# Patient Record
Sex: Female | Born: 1985 | Race: Black or African American | Hispanic: No | Marital: Married | Smoking: Never smoker
Health system: Southern US, Community
[De-identification: ages and names within clinical notes are randomized; demographics above are authoritative.]

## PROBLEM LIST (undated history)

## (undated) ENCOUNTER — Inpatient Hospital Stay (HOSPITAL_COMMUNITY): Payer: Self-pay

## (undated) DIAGNOSIS — L309 Dermatitis, unspecified: Secondary | ICD-10-CM

## (undated) DIAGNOSIS — R51 Headache: Secondary | ICD-10-CM

## (undated) DIAGNOSIS — F32A Depression, unspecified: Secondary | ICD-10-CM

## (undated) DIAGNOSIS — D649 Anemia, unspecified: Secondary | ICD-10-CM

## (undated) DIAGNOSIS — R519 Headache, unspecified: Secondary | ICD-10-CM

## (undated) DIAGNOSIS — B181 Chronic viral hepatitis B without delta-agent: Secondary | ICD-10-CM

## (undated) DIAGNOSIS — F329 Major depressive disorder, single episode, unspecified: Secondary | ICD-10-CM

## (undated) HISTORY — PX: TOOTH EXTRACTION: SUR596

---

## 2006-12-20 ENCOUNTER — Emergency Department (HOSPITAL_COMMUNITY): Admission: EM | Admit: 2006-12-20 | Discharge: 2006-12-20 | Payer: Self-pay | Admitting: *Deleted

## 2007-01-06 ENCOUNTER — Ambulatory Visit: Payer: Self-pay | Admitting: Internal Medicine

## 2007-03-04 ENCOUNTER — Inpatient Hospital Stay (HOSPITAL_COMMUNITY): Admission: AD | Admit: 2007-03-04 | Discharge: 2007-03-04 | Payer: Self-pay | Admitting: Family Medicine

## 2007-04-17 ENCOUNTER — Ambulatory Visit (HOSPITAL_COMMUNITY): Admission: RE | Admit: 2007-04-17 | Discharge: 2007-04-17 | Payer: Self-pay | Admitting: Obstetrics and Gynecology

## 2007-12-18 ENCOUNTER — Inpatient Hospital Stay (HOSPITAL_COMMUNITY): Admission: AD | Admit: 2007-12-18 | Discharge: 2007-12-18 | Payer: Self-pay | Admitting: Obstetrics & Gynecology

## 2008-05-25 ENCOUNTER — Inpatient Hospital Stay (HOSPITAL_COMMUNITY): Admission: AD | Admit: 2008-05-25 | Discharge: 2008-05-25 | Payer: Self-pay | Admitting: Obstetrics & Gynecology

## 2008-06-08 ENCOUNTER — Ambulatory Visit (HOSPITAL_COMMUNITY): Admission: RE | Admit: 2008-06-08 | Discharge: 2008-06-08 | Payer: Self-pay | Admitting: Family Medicine

## 2008-06-17 ENCOUNTER — Encounter: Payer: Self-pay | Admitting: Internal Medicine

## 2008-07-22 ENCOUNTER — Ambulatory Visit: Payer: Self-pay | Admitting: Obstetrics & Gynecology

## 2008-07-22 ENCOUNTER — Inpatient Hospital Stay (HOSPITAL_COMMUNITY): Admission: AD | Admit: 2008-07-22 | Discharge: 2008-07-24 | Payer: Self-pay | Admitting: Obstetrics & Gynecology

## 2008-07-24 ENCOUNTER — Inpatient Hospital Stay (HOSPITAL_COMMUNITY): Admission: AD | Admit: 2008-07-24 | Discharge: 2008-07-25 | Payer: Self-pay | Admitting: Obstetrics & Gynecology

## 2008-07-24 ENCOUNTER — Ambulatory Visit: Payer: Self-pay | Admitting: Advanced Practice Midwife

## 2008-07-26 ENCOUNTER — Inpatient Hospital Stay (HOSPITAL_COMMUNITY): Admission: AD | Admit: 2008-07-26 | Discharge: 2008-07-26 | Payer: Self-pay | Admitting: Obstetrics & Gynecology

## 2008-07-29 ENCOUNTER — Ambulatory Visit: Payer: Self-pay | Admitting: Obstetrics & Gynecology

## 2008-07-29 LAB — CONVERTED CEMR LAB

## 2008-08-12 ENCOUNTER — Ambulatory Visit: Payer: Self-pay | Admitting: Obstetrics & Gynecology

## 2008-08-26 ENCOUNTER — Ambulatory Visit: Payer: Self-pay | Admitting: Obstetrics & Gynecology

## 2008-08-28 ENCOUNTER — Ambulatory Visit: Payer: Self-pay | Admitting: Advanced Practice Midwife

## 2008-08-28 ENCOUNTER — Inpatient Hospital Stay (HOSPITAL_COMMUNITY): Admission: AD | Admit: 2008-08-28 | Discharge: 2008-08-28 | Payer: Self-pay | Admitting: Obstetrics and Gynecology

## 2008-09-06 ENCOUNTER — Inpatient Hospital Stay (HOSPITAL_COMMUNITY): Admission: AD | Admit: 2008-09-06 | Discharge: 2008-09-06 | Payer: Self-pay | Admitting: Obstetrics & Gynecology

## 2008-09-09 ENCOUNTER — Ambulatory Visit: Payer: Self-pay | Admitting: Obstetrics & Gynecology

## 2008-09-10 ENCOUNTER — Encounter: Payer: Self-pay | Admitting: Obstetrics & Gynecology

## 2008-09-16 ENCOUNTER — Ambulatory Visit: Payer: Self-pay | Admitting: Obstetrics & Gynecology

## 2008-09-23 ENCOUNTER — Ambulatory Visit: Payer: Self-pay | Admitting: Obstetrics & Gynecology

## 2008-09-25 ENCOUNTER — Inpatient Hospital Stay (HOSPITAL_COMMUNITY): Admission: AD | Admit: 2008-09-25 | Discharge: 2008-09-25 | Payer: Self-pay | Admitting: Obstetrics & Gynecology

## 2008-09-29 ENCOUNTER — Ambulatory Visit: Payer: Self-pay | Admitting: Advanced Practice Midwife

## 2008-09-29 ENCOUNTER — Inpatient Hospital Stay (HOSPITAL_COMMUNITY): Admission: AD | Admit: 2008-09-29 | Discharge: 2008-10-02 | Payer: Self-pay | Admitting: Obstetrics & Gynecology

## 2008-11-26 ENCOUNTER — Encounter: Payer: Self-pay | Admitting: Internal Medicine

## 2008-12-15 ENCOUNTER — Encounter (INDEPENDENT_AMBULATORY_CARE_PROVIDER_SITE_OTHER): Payer: Self-pay | Admitting: *Deleted

## 2008-12-15 DIAGNOSIS — B1910 Unspecified viral hepatitis B without hepatic coma: Secondary | ICD-10-CM | POA: Insufficient documentation

## 2008-12-15 DIAGNOSIS — E669 Obesity, unspecified: Secondary | ICD-10-CM

## 2008-12-21 ENCOUNTER — Ambulatory Visit: Payer: Self-pay | Admitting: Internal Medicine

## 2008-12-21 LAB — CONVERTED CEMR LAB
ALT: 29 units/L (ref 0–35)
Alkaline Phosphatase: 49 units/L (ref 39–117)
BUN: 7 mg/dL (ref 6–23)
Glucose, Bld: 81 mg/dL (ref 70–99)
HCT: 36.6 % (ref 36.0–46.0)
Hep A Total Ab: POSITIVE — AB
Hepatitis B DNA (Calc): 18100 copies/mL — ABNORMAL HIGH (ref <169)
Hepatitis B DNA: 3110 IU/mL — ABNORMAL HIGH (ref <29)
Hepatitis B Surface Ag: POSITIVE — AB
MCHC: 33.7 g/dL (ref 30.0–36.0)
MCV: 83.2 fL (ref >=78.0)
Prothrombin Time: 14 s (ref 11.6–15.2)
RBC: 4.41 M/uL (ref 3.87–5.11)
RDW: 13.9 % (ref 11.5–15.5)

## 2009-01-04 ENCOUNTER — Ambulatory Visit: Payer: Self-pay | Admitting: Internal Medicine

## 2009-01-19 ENCOUNTER — Encounter: Payer: Self-pay | Admitting: Internal Medicine

## 2009-02-03 ENCOUNTER — Encounter: Payer: Self-pay | Admitting: Internal Medicine

## 2009-08-08 ENCOUNTER — Encounter: Admission: RE | Admit: 2009-08-08 | Discharge: 2009-08-08 | Payer: Self-pay | Admitting: Infectious Diseases

## 2010-06-03 LAB — CBC
Hemoglobin: 11.9 g/dL — ABNORMAL LOW (ref 12.0–15.0)
RBC: 4.1 MIL/uL (ref 3.87–5.11)
RDW: 14.1 % (ref 11.5–15.5)

## 2010-06-03 LAB — RPR: RPR Ser Ql: NONREACTIVE

## 2010-06-04 LAB — POCT URINALYSIS DIP (DEVICE)
Bilirubin Urine: NEGATIVE
Bilirubin Urine: NEGATIVE
Glucose, UA: NEGATIVE mg/dL
Glucose, UA: NEGATIVE mg/dL
Glucose, UA: NEGATIVE mg/dL
Glucose, UA: NEGATIVE mg/dL
Hgb urine dipstick: NEGATIVE
Ketones, ur: NEGATIVE mg/dL
Ketones, ur: NEGATIVE mg/dL
Nitrite: NEGATIVE
Nitrite: NEGATIVE
Protein, ur: NEGATIVE mg/dL
Protein, ur: NEGATIVE mg/dL
Specific Gravity, Urine: 1.015 (ref 1.005–1.030)
Specific Gravity, Urine: 1.015 (ref 1.005–1.030)
Specific Gravity, Urine: 1.015 (ref 1.005–1.030)
Urobilinogen, UA: 0.2 mg/dL (ref 0.0–1.0)
Urobilinogen, UA: 0.2 mg/dL (ref 0.0–1.0)
Urobilinogen, UA: 0.2 mg/dL (ref 0.0–1.0)
Urobilinogen, UA: 0.2 mg/dL (ref 0.0–1.0)
pH: 7 (ref 5.0–8.0)
pH: 7.5 (ref 5.0–8.0)

## 2010-06-04 LAB — URINALYSIS, ROUTINE W REFLEX MICROSCOPIC
Glucose, UA: NEGATIVE mg/dL
Hgb urine dipstick: NEGATIVE
Ketones, ur: NEGATIVE mg/dL
Nitrite: NEGATIVE
Specific Gravity, Urine: 1.01 (ref 1.005–1.030)

## 2010-06-04 LAB — GC/CHLAMYDIA PROBE AMP, GENITAL
Chlamydia, DNA Probe: NEGATIVE
GC Probe Amp, Genital: NEGATIVE

## 2010-06-04 LAB — WET PREP, GENITAL
Clue Cells Wet Prep HPF POC: NONE SEEN
Trich, Wet Prep: NONE SEEN
Yeast Wet Prep HPF POC: NONE SEEN

## 2010-06-05 LAB — POCT URINALYSIS DIP (DEVICE)
Bilirubin Urine: NEGATIVE
Glucose, UA: NEGATIVE mg/dL
Hgb urine dipstick: NEGATIVE
Hgb urine dipstick: NEGATIVE
Ketones, ur: NEGATIVE mg/dL
Nitrite: NEGATIVE
Nitrite: NEGATIVE
Protein, ur: NEGATIVE mg/dL
Specific Gravity, Urine: 1.015 (ref 1.005–1.030)
Urobilinogen, UA: 0.2 mg/dL (ref 0.0–1.0)
pH: 7 (ref 5.0–8.0)

## 2010-06-06 LAB — WET PREP, GENITAL: Yeast Wet Prep HPF POC: NONE SEEN

## 2010-06-06 LAB — URINALYSIS, ROUTINE W REFLEX MICROSCOPIC
Hgb urine dipstick: NEGATIVE
Specific Gravity, Urine: <1.005 — ABNORMAL LOW (ref 1.005–1.030)
Urobilinogen, UA: 0.2 mg/dL (ref 0.0–1.0)
pH: 7.5 (ref 5.0–8.0)

## 2010-06-06 LAB — STREP B DNA PROBE: Strep Group B Ag: NEGATIVE

## 2010-06-06 LAB — GC/CHLAMYDIA PROBE AMP, GENITAL
Chlamydia, DNA Probe: NEGATIVE
GC Probe Amp, Genital: NEGATIVE

## 2010-06-06 LAB — URINALYSIS, MICROSCOPIC ONLY
Nitrite: NEGATIVE
Protein, ur: NEGATIVE mg/dL
Urobilinogen, UA: 0.2 mg/dL (ref 0.0–1.0)

## 2010-06-06 LAB — RAPID URINE DRUG SCREEN, HOSP PERFORMED
Benzodiazepines: NOT DETECTED
Cocaine: NOT DETECTED

## 2010-06-06 LAB — CBC
HCT: 32.3 % — ABNORMAL LOW (ref 36.0–46.0)
Hemoglobin: 11.1 g/dL — ABNORMAL LOW (ref 12.0–15.0)
MCHC: 34.4 g/dL (ref 30.0–36.0)

## 2010-06-08 LAB — DIFFERENTIAL
Basophils Absolute: 0 10*3/uL (ref 0.0–0.1)
Basophils Relative: 1 % (ref 0–1)
Eosinophils Absolute: 0.2 10*3/uL (ref 0.0–0.7)
Monocytes Relative: 7 % (ref 3–12)
Neutro Abs: 4.8 10*3/uL (ref 1.7–7.7)
Neutrophils Relative %: 66 % (ref 43–77)

## 2010-06-08 LAB — URINALYSIS, ROUTINE W REFLEX MICROSCOPIC
Glucose, UA: NEGATIVE mg/dL
Hgb urine dipstick: NEGATIVE
Ketones, ur: NEGATIVE mg/dL
Protein, ur: NEGATIVE mg/dL
pH: 8 (ref 5.0–8.0)

## 2010-06-08 LAB — CBC
MCHC: 33.2 g/dL (ref 30.0–36.0)
MCV: 83.1 fL (ref 78.0–100.0)
Platelets: 208 10*3/uL (ref 150–400)
RBC: 3.83 MIL/uL — ABNORMAL LOW (ref 3.87–5.11)
RDW: 13.8 % (ref 11.5–15.5)

## 2010-06-08 LAB — GC/CHLAMYDIA PROBE AMP, GENITAL
Chlamydia, DNA Probe: NEGATIVE
GC Probe Amp, Genital: NEGATIVE

## 2010-06-08 LAB — RUBELLA SCREEN: Rubella: 38.8 IU/mL — ABNORMAL HIGH

## 2010-06-08 LAB — WET PREP, GENITAL
Clue Cells Wet Prep HPF POC: NONE SEEN
Trich, Wet Prep: NONE SEEN
Yeast Wet Prep HPF POC: NONE SEEN

## 2010-06-08 LAB — RPR: RPR Ser Ql: NONREACTIVE

## 2010-07-11 NOTE — Discharge Summary (Signed)
NAMEULANI, DEGRASSE NO.:  [id number]   MEDICAL RECORD NO.:  [id number]           PATIENT TYPE:   LOCATION:                                 FACILITY:   PHYSICIAN:  Scheryl Darter, MD       DATE OF BIRTH:  1985-04-04   DATE OF ADMISSION:  07/03/2008  DATE OF DISCHARGE:                               DISCHARGE SUMMARY   DIAGNOSIS:  Twenty nine weeks' gestation with preterm labor.   The patient is a 25 year old African American female gravida 2, para 1,  abortus 1, last menstrual period was unsure, approximately January 06, 2008, with estimated date of confinement by ultrasound October 07, 2008,  who was admitted on Jul 03, 2008, due to preterm labor at 63 weeks'  gestation.  The patient was having irregular contractions.  No bleeding.  No rupture of membranes.  Being cared at [facility].   PAST OBSTETRICAL HISTORY:  One elective abortion and one term, vaginal  delivery at 38-1/2 weeks in [place], female 7 pounds 9 ounces.   SOCIAL HISTORY:  The patient is married.  She is originally from  [place].  She lived for several years in [place].  Her husband is  35  years old.  She denies alcohol, tobacco, or drug use.   GYN HISTORY:  The patient had Chlamydia in June 2009.   FAMILY HISTORY:  Unremarkable for any genetic diseases.   PHYSICAL EXAMINATION:  VITAL SIGNS:  Admission weight is 119 pounds.  CHEST:  Clear.  HEART:  Regular rate and rhythm.  ABDOMEN:  Gravid.  Cervix initially was thought to have a parous external os with closed  internal os, but repeat exam, cervix was 50% effaced, 1-2 cm dilated,  cephalic, -2 station.  Fetal fibronectin was negative.  The patient was  admitted due to preterm labor.  She was placed on magnesium sulfate and  she received 2 doses of betamethasone.  Magnesium sulfate was stopped  after 12 hours.  She also received 1 dose of subcu terbutaline, which  caused increased anxiety.  She refused to start  magnesium sulfate again.  Apparently, contraction did not resume after having tocolytics.   PRENATAL LABORATORY DATA:  The patient had positive hepatitis B surface  antigen screening, which was confirmatory.  Plan was patient to receive  hepatitis B immunoglobulin in the emergency department.   IMPRESSION:  1. Intrauterine pregnancy of 29 weeks' gestation, preterm labor,      arrested.  2. Hepatitis B surface antigen.   PLAN:  The patient will be discharged home.  Prescription for Procardia  ER 30 mg a day.  She was instructed in signs and symptoms of preterm  labor.  She was instructed to have decreased activity and pelvic rest.  She is to return to follow up at the [facility] on July 29, 2008.      Scheryl Darter, MD  Electronically Signed     JA/MEDQ  D:  07/24/2008  T:  07/25/2008  Job:  [id number]

## 2010-10-08 ENCOUNTER — Encounter (HOSPITAL_COMMUNITY): Payer: Self-pay | Admitting: *Deleted

## 2010-10-08 ENCOUNTER — Inpatient Hospital Stay (HOSPITAL_COMMUNITY)
Admission: AD | Admit: 2010-10-08 | Discharge: 2010-10-09 | Disposition: A | Discharge status: Home / Self Care | Payer: Self-pay | Source: Ambulatory Visit | Attending: Obstetrics & Gynecology | Admitting: Obstetrics & Gynecology

## 2010-10-08 DIAGNOSIS — R109 Unspecified abdominal pain: Secondary | ICD-10-CM | POA: Insufficient documentation

## 2010-10-08 DIAGNOSIS — N83209 Unspecified ovarian cyst, unspecified side: Secondary | ICD-10-CM | POA: Insufficient documentation

## 2010-10-08 LAB — URINALYSIS, ROUTINE W REFLEX MICROSCOPIC
Bilirubin Urine: NEGATIVE
Glucose, UA: NEGATIVE mg/dL
Leukocytes, UA: NEGATIVE
Nitrite: NEGATIVE
Specific Gravity, Urine: 1.02 (ref 1.005–1.030)
pH: 6.5 (ref 5.0–8.0)

## 2010-10-08 NOTE — Progress Notes (Signed)
Pt LMP beginning of June.  Pt has implanon.  Pt having increased lower abd cramping and spotting x 2 days.

## 2010-10-08 NOTE — Progress Notes (Signed)
Pt c/o lower abd pain.  Has implanon inserted 01/2009.

## 2010-10-08 NOTE — ED Provider Notes (Signed)
History     Chief Complaint  Patient presents with  . Abdominal Pain   HPI  Pt reports intermittent suprapubic pelvic pain x 2 weeks.  LMP June; using Implanon.  Reports dark vaginal discharge, occurs commonly.  Past Medical History  Diagnosis Date  . No pertinent past medical history     Past Surgical History  Procedure Date  . No past surgeries     No family history on file.  History  Substance Use Topics  . Smoking status: Never Smoker   . Smokeless tobacco: Not on file  . Alcohol Use: No    Allergies: No Known Allergies  No prescriptions prior to admission    Review of Systems  Gastrointestinal: Positive for abdominal pain.  Genitourinary:       Dark red discharge  All other systems reviewed and are negative.   Physical Exam   Blood pressure 111/78, pulse 75, temperature 98.2 F (36.8 C), temperature source Oral, resp. rate 20, height 5\' 5"  (1.651 m), weight 92.352 kg (203 lb 9.6 oz), last menstrual period 07/28/2010.  Physical Exam  Constitutional: She is oriented to person, place, and time. She appears well-developed and well-nourished. No distress (uncomfortable appearing w/exam).  HENT:  Head: Normocephalic.  Neck: Normal range of motion. Neck supple.  Cardiovascular: Normal rate, regular rhythm and normal heart sounds.   Respiratory: Effort normal and breath sounds normal.  GI: Soft. She exhibits mass. There is tenderness.  Genitourinary: Right adnexum displays tenderness. There is bleeding around the vagina. Vaginal discharge (dark blood) found.  Neurological: She is alert and oriented to person, place, and time. She has normal reflexes.  Skin: Skin is warm and dry.    MAU Course  Procedures  Ultrasound LDH/AFP level Wet Prep GC/CT  Assessment and Plan  Ovarian Cyst  Plan: DC to home Repeat US in 8 weeks Rx Tylenol#3 F/U in clinic in 8-10 weeks   [facility] 10/08/2010, 11:54 PM

## 2010-10-09 ENCOUNTER — Telehealth: Payer: Self-pay | Admitting: Family

## 2010-10-09 ENCOUNTER — Inpatient Hospital Stay (HOSPITAL_COMMUNITY): Payer: Self-pay

## 2010-10-09 ENCOUNTER — Other Ambulatory Visit: Payer: Self-pay | Admitting: Obstetrics & Gynecology

## 2010-10-09 LAB — WET PREP, GENITAL: Yeast Wet Prep HPF POC: NONE SEEN

## 2010-10-09 MED ORDER — IBUPROFEN 600 MG PO TABS: 600.0000 mg | ORAL_TABLET | Freq: Four times a day (QID) | ORAL | Status: AC | PRN

## 2010-10-09 MED ORDER — PROMETHAZINE HCL 12.5 MG PO TABS: 12.5000 mg | ORAL_TABLET | Freq: Four times a day (QID) | ORAL | Status: DC | PRN

## 2010-10-09 MED ORDER — ACETAMINOPHEN-CODEINE 300-30 MG PO TABS: 1.0000 | ORAL_TABLET | ORAL | Status: AC | PRN

## 2010-10-09 MED ORDER — ONDANSETRON 8 MG PO TBDP
8.0000 mg | ORAL_TABLET | Freq: Once | ORAL | Status: AC
  Administered 2010-10-09: 8 mg via ORAL
  Filled 2010-10-09: qty 1

## 2010-10-09 NOTE — Consult Note (Signed)
Reviewed HPI/Exam/US results>draw LDH and AFP level and have pt follow-up in clinic in 8-10 weeks after scheduled repeat US.

## 2010-10-09 NOTE — ED Provider Notes (Signed)
Called Amber in ultrasound to confirm +flow to ovaries.

## 2010-10-09 NOTE — ED Provider Notes (Signed)
Agree with above note.  Paige Bates H. 10/09/2010 6:12 AM

## 2010-10-09 NOTE — ED Provider Notes (Signed)
Agree with above note.  Paige Bates H. 10/09/2010 6:13 AM

## 2010-10-10 LAB — GC/CHLAMYDIA PROBE AMP, GENITAL
Chlamydia, DNA Probe: NEGATIVE
GC Probe Amp, Genital: NEGATIVE

## 2010-11-16 LAB — URINALYSIS, ROUTINE W REFLEX MICROSCOPIC
Glucose, UA: NEGATIVE
Hgb urine dipstick: NEGATIVE
pH: 7.5

## 2010-11-16 LAB — GC/CHLAMYDIA PROBE AMP, GENITAL
Chlamydia, DNA Probe: POSITIVE — AB
GC Probe Amp, Genital: NEGATIVE

## 2010-11-23 ENCOUNTER — Encounter: Payer: Self-pay | Admitting: Obstetrics & Gynecology

## 2010-11-27 LAB — GC/CHLAMYDIA PROBE AMP, GENITAL
Chlamydia, DNA Probe: NEGATIVE
GC Probe Amp, Genital: NEGATIVE

## 2010-11-27 LAB — CBC
Platelets: 285
RBC: 4.5
WBC: 4.8

## 2010-11-27 LAB — WET PREP, GENITAL
Clue Cells Wet Prep HPF POC: NONE SEEN
Trich, Wet Prep: NONE SEEN

## 2010-12-06 LAB — GC/CHLAMYDIA PROBE AMP, GENITAL: Chlamydia, DNA Probe: POSITIVE — AB

## 2010-12-06 LAB — I-STAT 8, (EC8 V) (CONVERTED LAB)
BUN: 5 — ABNORMAL LOW
Bicarbonate: 24.7 — ABNORMAL HIGH
Chloride: 105
Glucose, Bld: 84
Hemoglobin: 11.6 — ABNORMAL LOW
Operator id: 294501
Sodium: 139

## 2010-12-06 LAB — URINALYSIS, ROUTINE W REFLEX MICROSCOPIC
Bilirubin Urine: NEGATIVE
Glucose, UA: NEGATIVE
Hgb urine dipstick: NEGATIVE
Protein, ur: NEGATIVE
Specific Gravity, Urine: 1.02
Urobilinogen, UA: 1

## 2010-12-06 LAB — WET PREP, GENITAL: Yeast Wet Prep HPF POC: NONE SEEN

## 2010-12-06 LAB — POCT PREGNANCY, URINE: Operator id: 28833

## 2010-12-06 LAB — RPR: RPR Ser Ql: NONREACTIVE

## 2011-03-09 ENCOUNTER — Emergency Department (INDEPENDENT_AMBULATORY_CARE_PROVIDER_SITE_OTHER)
Admission: EM | Admit: 2011-03-09 | Discharge: 2011-03-09 | Disposition: A | Discharge status: Home / Self Care | Payer: Self-pay | Source: Home / Self Care | Attending: Emergency Medicine | Admitting: Emergency Medicine

## 2011-03-09 ENCOUNTER — Encounter (HOSPITAL_COMMUNITY): Payer: Self-pay | Admitting: Emergency Medicine

## 2011-03-09 DIAGNOSIS — H68009 Unspecified Eustachian salpingitis, unspecified ear: Secondary | ICD-10-CM

## 2011-03-09 MED ORDER — CHLORPHENIRAMINE-PSEUDOEPH 12-120 MG PO TB12: 1.0000 | ORAL_TABLET | Freq: Two times a day (BID) | ORAL | Status: DC

## 2011-03-09 NOTE — ED Provider Notes (Signed)
Chief Complaint  Patient presents with  . 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 She did not feel feverish. Her symptoms have gotten better with the exception of bilateral ear congestion. She denies any pain or drainage. She had or ears tested by the company nurse and was told that she was unable to fly because of ear congestion. She comes in today for recheck.  Review of Systems:  Other than noted above, the patient denies any of the following symptoms. Systemic:  No fever, chills, sweats, fatigue, myalgias, headache, or anorexia. Eye:  No redness, pain or drainage. ENT:  No earache, nasal congestion, rhinorrhea, sinus pressure, or sore throat. Lungs:  No cough, sputum production, wheezing, shortness of breath. Or chest pain. GI:  No nausea, vomiting, abdominal pain or diarrhea. Skin:  No rash or itching.  PMFSH:  Past medical history, family history, social history, meds, and allergies were reviewed.  Physical Exam:   Vital signs:  BP 110/68  Pulse 79  Temp(Src) 98.7 F (37.1 C) (Oral)  Resp 20  SpO2 99% General:  Alert, in no distress. Eye:  No conjunctival injection or drainage. ENT:  TMs and canals were normal, without erythema or inflammation.  Nasal mucosa was clear and uncongested, without drainage.  Mucous membranes were moist.  Pharynx was clear, without exudate or drainage.  There were no oral ulcerations or lesions. Neck:  Supple, no adenopathy, tenderness or mass. Lungs:  No respiratory distress.  Lungs were clear to auscultation, without wheezes, rales or rhonchi.  Breath sounds were clear and equal bilaterally. Heart:  Regular rhythm, without gallops, murmers or rubs. Skin:  Clear, warm, and dry, without rash or lesions.  Assessment:   Diagnoses that have been ruled out:  None  Diagnoses that are still under consideration:  None    Final diagnoses:  Eustachian salpingitis      Plan:   1.  The following meds were prescribed:   New Prescriptions   CHLORPHENIRAMINE-PSEUDOEPH 12-120 MG TB12    Take 1 tablet by mouth 2 (two) times daily.   2.  The patient was instructed in symptomatic care and handouts were given. 3.  The patient was told to return if becoming worse in any way, if no better in 3 or 4 days, and given some red flag symptoms that would indicate earlier return.   Roque Lias, MD 03/09/11 [phone]

## 2011-03-09 NOTE — ED Notes (Signed)
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

## 2011-11-17 ENCOUNTER — Encounter (HOSPITAL_COMMUNITY): Payer: Self-pay

## 2011-11-17 ENCOUNTER — Inpatient Hospital Stay (HOSPITAL_COMMUNITY)
Admission: AD | Admit: 2011-11-17 | Discharge: 2011-11-17 | Disposition: A | Discharge status: Home / Self Care | Payer: Medicaid Other | Source: Ambulatory Visit | Attending: Obstetrics & Gynecology | Admitting: Obstetrics & Gynecology

## 2011-11-17 DIAGNOSIS — O219 Vomiting of pregnancy, unspecified: Secondary | ICD-10-CM

## 2011-11-17 DIAGNOSIS — R109 Unspecified abdominal pain: Secondary | ICD-10-CM | POA: Insufficient documentation

## 2011-11-17 DIAGNOSIS — O210 Mild hyperemesis gravidarum: Secondary | ICD-10-CM | POA: Insufficient documentation

## 2011-11-17 DIAGNOSIS — K117 Disturbances of salivary secretion: Secondary | ICD-10-CM | POA: Insufficient documentation

## 2011-11-17 DIAGNOSIS — O9989 Other specified diseases and conditions complicating pregnancy, childbirth and the puerperium: Secondary | ICD-10-CM | POA: Insufficient documentation

## 2011-11-17 HISTORY — DX: Depression, unspecified: F32.A

## 2011-11-17 HISTORY — DX: Anemia, unspecified: D64.9

## 2011-11-17 HISTORY — DX: Major depressive disorder, single episode, unspecified: F32.9

## 2011-11-17 LAB — URINALYSIS, ROUTINE W REFLEX MICROSCOPIC
Bilirubin Urine: NEGATIVE
Leukocytes, UA: NEGATIVE
Nitrite: NEGATIVE
Specific Gravity, Urine: >1.03 — ABNORMAL HIGH (ref 1.005–1.030)
Urobilinogen, UA: 0.2 mg/dL (ref 0.0–1.0)
pH: 6 (ref 5.0–8.0)

## 2011-11-17 LAB — URINE MICROSCOPIC-ADD ON

## 2011-11-17 MED ORDER — ONDANSETRON 8 MG PO TBDP: 8.0000 mg | ORAL_TABLET | Freq: Three times a day (TID) | ORAL | Status: DC | PRN

## 2011-11-17 MED ORDER — ONDANSETRON 8 MG PO TBDP
8.0000 mg | ORAL_TABLET | Freq: Once | ORAL | Status: AC
  Administered 2011-11-17: 8 mg via ORAL
  Filled 2011-11-17: qty 1

## 2011-11-17 MED ORDER — GLYCOPYRROLATE 1 MG PO TABS: 1.0000 mg | ORAL_TABLET | Freq: Three times a day (TID) | ORAL | Status: DC

## 2011-11-17 MED ORDER — PROMETHAZINE HCL 25 MG PO TABS: 25.0000 mg | ORAL_TABLET | Freq: Four times a day (QID) | ORAL | Status: DC | PRN

## 2011-11-17 MED ORDER — SODIUM CHLORIDE 0.9 % IV SOLN: 80.0000 mg | Freq: Every day | INTRAVENOUS | Status: DC

## 2011-11-17 MED ORDER — FAMOTIDINE 20 MG PO TABS
20.0000 mg | ORAL_TABLET | Freq: Once | ORAL | Status: AC
  Administered 2011-11-17: 20 mg via ORAL
  Filled 2011-11-17: qty 1

## 2011-11-17 MED ORDER — OMEPRAZOLE 20 MG PO CPDR: 20.0000 mg | DELAYED_RELEASE_CAPSULE | Freq: Every day | ORAL | Status: DC

## 2011-11-17 NOTE — MAU Note (Signed)
Vomiting since last week, unable to keep food or drink down. States there is now blood in her vomit. Lower abdominal cramping 2-3 weeks ago. States also can't sleep.

## 2011-11-17 NOTE — MAU Provider Note (Signed)
Attestation of Attending Supervision of Advanced Practitioner (CNM/NP): Evaluation and management procedures were performed by the Advanced Practitioner under my supervision and collaboration.  I have reviewed the Advanced Practitioner's note and chart, and I agree with the management and plan.  Jinelle Butchko, MD, FACOG Attending Obstetrician & Gynecologist Faculty Practice, [facility]  

## 2011-11-17 NOTE — MAU Provider Note (Addendum)
History     CSN: [id number]  Arrival date and time: 11/17/11 4098   First Provider Initiated Contact with Patient 11/17/11 1937      Chief Complaint  Patient presents with  . Emesis During Pregnancy  . Abdominal Pain   HPI 26 y.o. [id number] at [redacted]w[redacted]d with n/v since onset of pregnancy, noticed blood streaks in vomitus starting about 2 weeks ago. No meds at home. No better with sea bands or ginger. Also c/o low abd pain, cramping, worse on left side, radiates to back, worse at night. + diarrhea since yesterday (3 loose stools since yesterday). Ate pizza, was able to keep this down. C/O heartburn and spitting as well.     Past Medical History  Diagnosis Date  . Depression     no meds  . Anemia   . Preterm labor     PTL with 2nd child    Past Surgical History  Procedure Date  . No past surgeries     Family History  Problem Relation Age of Onset  . Other Neg Hx     History  Substance Use Topics  . Smoking status: Never Smoker   . Smokeless tobacco: Not on file  . Alcohol Use: No    Allergies:  Allergies  Allergen Reactions  . Latex Itching and Rash    Prescriptions prior to admission  Medication Sig Dispense Refill  . acetaminophen (TYLENOL) 325 MG tablet Take 650 mg by mouth every 6 (six) hours as needed.      . Chlorpheniramine-Pseudoeph 12-120 MG TB12 Take 1 tablet by mouth 2 (two) times daily.  20 each  0  . prenatal vitamin w/FE, FA (PRENATAL 1 + 1) 27-1 MG TABS Take 1 tablet by mouth daily.          Review of Systems  Constitutional: Negative.   Respiratory: Negative.   Cardiovascular: Negative.   Gastrointestinal: Positive for heartburn, nausea, vomiting, abdominal pain and diarrhea. Negative for constipation.  Genitourinary: Negative for dysuria, urgency, frequency, hematuria and flank pain.       Negative for vaginal bleeding, vaginal discharge, dyspareunia  Musculoskeletal: Negative.   Neurological: Negative.   Psychiatric/Behavioral: Negative.     Physical Exam   Blood pressure 117/70, pulse 80, temperature 99.7 F (37.6 C), temperature source Oral, resp. rate 16, last menstrual period 09/24/2011.  Physical Exam  Nursing note and vitals reviewed. Constitutional: She is oriented to person, place, and time. She appears well-developed and well-nourished. No distress.  Cardiovascular: Normal rate.   Respiratory: Effort normal.  GI: Soft. There is no tenderness.  Musculoskeletal: Normal range of motion.  Neurological: She is alert and oriented to person, place, and time.  Skin: Skin is warm and dry.  Psychiatric: She has a normal mood and affect.    MAU Course  Procedures  + FHR on bedside u/s Results for orders placed during the hospital encounter of 11/17/11 (from the past 24 hour(s))  URINALYSIS, ROUTINE W REFLEX MICROSCOPIC     Status: Abnormal   Collection Time   11/17/11  7:14 PM      Component Value Range   Color, Urine YELLOW  YELLOW   APPearance CLEAR  CLEAR   Specific Gravity, Urine >1.030 (*) 1.005 - 1.030   pH 6.0  5.0 - 8.0   Glucose, UA NEGATIVE  NEGATIVE mg/dL   Hgb urine dipstick TRACE (*) NEGATIVE   Bilirubin Urine NEGATIVE  NEGATIVE   Ketones, ur NEGATIVE  NEGATIVE mg/dL   Protein, ur  NEGATIVE  NEGATIVE mg/dL   Urobilinogen, UA 0.2  0.0 - 1.0 mg/dL   Nitrite NEGATIVE  NEGATIVE   Leukocytes, UA NEGATIVE  NEGATIVE  URINE MICROSCOPIC-ADD ON     Status: Abnormal   Collection Time   11/17/11  7:14 PM      Component Value Range   Squamous Epithelial / LPF RARE  RARE   WBC, UA 0-2  <3 WBC/hpf   RBC / HPF 0-2  <3 RBC/hpf   Bacteria, UA FEW (*) RARE       . famotidine  20 mg Oral Once  . ondansetron  8 mg Oral Once     Assessment and Plan  N/V of pregnancy - Zofran and pepcid ordered Ptyalism Care assumed by M. Maris Berger  [facility] 11/17/2011, 7:58 PM   Feels better, wants to go home Rx Zofran, Phenergan, Robinul and Prilosec given Will refer to clinic.  ?need to be in HR for Hep  B+

## 2011-11-17 NOTE — MAU Provider Note (Signed)
Attestation of Attending Supervision of Advanced Practitioner (CNM/NP): Evaluation and management procedures were performed by the Advanced Practitioner under my supervision and collaboration.  I have reviewed the Advanced Practitioner's note and chart, and I agree with the management and plan.  Gedalya Jim, MD, FACOG Attending Obstetrician & Gynecologist Faculty Practice, [facility]  

## 2011-11-20 ENCOUNTER — Other Ambulatory Visit: Payer: Self-pay | Admitting: Advanced Practice Midwife

## 2011-11-20 MED ORDER — RANITIDINE HCL 150 MG PO TABS: 150.0000 mg | ORAL_TABLET | Freq: Two times a day (BID) | ORAL | Status: DC

## 2011-11-20 MED ORDER — GLYCOPYRROLATE 1 MG PO TABS: 1.0000 mg | ORAL_TABLET | Freq: Three times a day (TID) | ORAL | Status: DC

## 2011-11-20 MED ORDER — ONDANSETRON HCL 8 MG PO TABS: 8.0000 mg | ORAL_TABLET | Freq: Three times a day (TID) | ORAL | Status: DC | PRN

## 2011-11-29 ENCOUNTER — Other Ambulatory Visit: Payer: Self-pay | Admitting: Family Medicine

## 2011-11-29 ENCOUNTER — Encounter: Payer: Self-pay | Admitting: *Deleted

## 2011-11-29 ENCOUNTER — Ambulatory Visit (INDEPENDENT_AMBULATORY_CARE_PROVIDER_SITE_OTHER): Payer: Self-pay | Admitting: Family Medicine

## 2011-11-29 ENCOUNTER — Encounter: Payer: Self-pay | Admitting: Family Medicine

## 2011-11-29 VITALS — BP 119/74 | Temp 100.0°F | Ht 68.11 in | Wt 211.1 lb

## 2011-11-29 DIAGNOSIS — O0990 Supervision of high risk pregnancy, unspecified, unspecified trimester: Secondary | ICD-10-CM

## 2011-11-29 DIAGNOSIS — B1910 Unspecified viral hepatitis B without hepatic coma: Secondary | ICD-10-CM

## 2011-11-29 DIAGNOSIS — O98519 Other viral diseases complicating pregnancy, unspecified trimester: Secondary | ICD-10-CM

## 2011-11-29 DIAGNOSIS — IMO0002 Reserved for concepts with insufficient information to code with codable children: Secondary | ICD-10-CM

## 2011-11-29 LAB — POCT URINALYSIS DIP (DEVICE)
Bilirubin Urine: NEGATIVE
Hgb urine dipstick: NEGATIVE
Leukocytes, UA: NEGATIVE
Nitrite: NEGATIVE
Protein, ur: 30 mg/dL — AB
pH: 8.5 — ABNORMAL HIGH (ref 5.0–8.0)

## 2011-11-29 MED ORDER — LANSOPRAZOLE 15 MG PO CPDR: 15.0000 mg | DELAYED_RELEASE_CAPSULE | Freq: Every day | ORAL | Status: DC

## 2011-11-29 NOTE — Progress Notes (Signed)
U/S scheduled 12/05/11 at 745 am. Nuchal Translucency scheduled with MFM on 12/24/11 at 11 am.

## 2011-11-29 NOTE — Progress Notes (Signed)
P = 90 Patient c/o constant nausea and frequent vomiting; medications for nausea are not working, make her very tired and give her HA and blurred vision. Medications for the spitting is not working. LBP and lower abdominal pain

## 2011-11-29 NOTE — Patient Instructions (Signed)
Take ranitidine twice a day and lansoprazole (prevacid) once a day. Try Zofran 8 mg tablet (1/2 to 1 tablet every 8 hours) for nausea/vomiting. If it works, stop promethazine.  Pregnancy - First Trimester During sexual intercourse, millions of sperm go into the vagina. Only 1 sperm will penetrate and fertilize the female egg while it is in the Fallopian tube. One week later, the fertilized egg implants into the wall of the uterus. An embryo begins to develop into a baby. At 6 to 8 weeks, the eyes and face are formed and the heartbeat can be seen on ultrasound. At the end of 12 weeks (first trimester), all the baby's organs are formed. Now that you are pregnant, you will want to do everything you can to have a healthy baby. Two of the most important things are to get good prenatal care and follow your caregiver's instructions. Prenatal care is all the medical care you receive before the baby's birth. It is given to prevent, find, and treat problems during the pregnancy and childbirth. PRENATAL EXAMS  During prenatal visits, your weight, blood pressure and urine are checked. This is done to make sure you are healthy and progressing normally during the pregnancy.  A pregnant woman should gain 25 to 35 pounds during the pregnancy. However, if you are over weight or underweight, your caregiver will advise you regarding your weight.  Your caregiver will ask and answer questions for you.  Blood work, cervical cultures, other necessary tests and a Pap test are done during your prenatal exams. These tests are done to check on your health and the probable health of your baby. Tests are strongly recommended and done for HIV with your permission. This is the virus that causes AIDS. These tests are done because medications can be given to help prevent your baby from being born with this infection should you have been infected without knowing it. Blood work is also used to find out your blood type, previous infections  and follow your blood levels (hemoglobin).  Low hemoglobin (anemia) is common during pregnancy. Iron and vitamins are given to help prevent this. Later in the pregnancy, blood tests for diabetes will be done along with any other tests if any problems develop. You may need tests to make sure you and the baby are doing well.  You may need other tests to make sure you and the baby are doing well. CHANGES DURING THE FIRST TRIMESTER (THE FIRST 3 MONTHS OF PREGNANCY) Your body goes through many changes during pregnancy. They vary from person to person. Talk to your caregiver about changes you notice and are concerned about. Changes can include:  Your menstrual period stops.  The egg and sperm carry the genes that determine what you look like. Genes from you and your partner are forming a baby. The female genes determine whether the baby is a boy or a girl.  Your body increases in girth and you may feel bloated.  Feeling sick to your stomach (nauseous) and throwing up (vomiting). If the vomiting is uncontrollable, call your caregiver.  Your breasts will begin to enlarge and become tender.  Your nipples may stick out more and become darker.  The need to urinate more. Painful urination may mean you have a bladder infection.  Tiring easily.  Loss of appetite.  Cravings for certain kinds of food.  At first, you may gain or lose a couple of pounds.  You may have changes in your emotions from day to day (excited to  be pregnant or concerned something may go wrong with the pregnancy and baby).  You may have more vivid and strange dreams. HOME CARE INSTRUCTIONS   It is very important to avoid all smoking, alcohol and un-prescribed drugs during your pregnancy. These affect the formation and growth of the baby. Avoid chemicals while pregnant to ensure the delivery of a healthy infant.  Start your prenatal visits by the 12th week of pregnancy. They are usually scheduled monthly at first, then more  often in the last 2 months before delivery. Keep your caregiver's appointments. Follow your caregiver's instructions regarding medication use, blood and lab tests, exercise, and diet.  During pregnancy, you are providing food for you and your baby. Eat regular, well-balanced meals. Choose foods such as meat, fish, milk and other low fat dairy products, vegetables, fruits, and whole-grain breads and cereals. Your caregiver will tell you of the ideal weight gain.  You can help morning sickness by keeping soda crackers at the bedside. Eat a couple before arising in the morning. You may want to use the crackers without salt on them.  Eating 4 to 5 small meals rather than 3 large meals a day also may help the nausea and vomiting.  Drinking liquids between meals instead of during meals also seems to help nausea and vomiting.  A physical sexual relationship may be continued throughout pregnancy if there are no other problems. Problems may be early (premature) leaking of amniotic fluid from the membranes, vaginal bleeding, or belly (abdominal) pain.  Exercise regularly if there are no restrictions. Check with your caregiver or physical therapist if you are unsure of the safety of some of your exercises. Greater weight gain will occur in the last 2 trimesters of pregnancy. Exercising will help:  Control your weight.  Keep you in shape.  Prepare you for labor and delivery.  Help you lose your pregnancy weight after you deliver your baby.  Wear a good support or jogging bra for breast tenderness during pregnancy. This may help if worn during sleep too.  Ask when prenatal classes are available. Begin classes when they are offered.  Do not use hot tubs, steam rooms or saunas.  Wear your seat belt when driving. This protects you and your baby if you are in an accident.  Avoid raw meat, uncooked cheese, cat litter boxes and soil used by cats throughout the pregnancy. These carry germs that can cause  birth defects in the baby.  The first trimester is a good time to visit your dentist for your dental health. Getting your teeth cleaned is OK. Use a softer toothbrush and brush gently during pregnancy.  Ask for help if you have financial, counseling or nutritional needs during pregnancy. Your caregiver will be able to offer counseling for these needs as well as refer you for other special needs.  Do not take any medications or herbs unless told by your caregiver.  Inform your caregiver if there is any mental or physical domestic violence.  Make a list of emergency phone numbers of family, friends, hospital, and police and fire departments.  Write down your questions. Take them to your prenatal visit.  Do not douche.  Do not cross your legs.  If you have to stand for long periods of time, rotate you feet or take small steps in a circle.  You may have more vaginal secretions that may require a sanitary pad. Do not use tampons or scented sanitary pads. MEDICATIONS AND DRUG USE IN PREGNANCY  Take  prenatal vitamins as directed. The vitamin should contain 1 milligram of folic acid. Keep all vitamins out of reach of children. Only a couple vitamins or tablets containing iron may be fatal to a baby or young child when ingested.  Avoid use of all medications, including herbs, over-the-counter medications, not prescribed or suggested by your caregiver. Only take over-the-counter or prescription medicines for pain, discomfort, or fever as directed by your caregiver. Do not use aspirin, ibuprofen, or naproxen unless directed by your caregiver.  Let your caregiver also know about herbs you may be using.  Alcohol is related to a number of birth defects. This includes fetal alcohol syndrome. All alcohol, in any form, should be avoided completely. Smoking will cause low birth rate and premature babies.  Street or illegal drugs are very harmful to the baby. They are absolutely forbidden. A baby born to  an addicted mother will be addicted at birth. The baby will go through the same withdrawal an adult does.  Let your caregiver know about any medications that you have to take and for what reason you take them. MISCARRIAGE IS COMMON DURING PREGNANCY A miscarriage does not mean you did something wrong. It is not a reason to worry about getting pregnant again. Your caregiver will help you with questions you may have. If you have a miscarriage, you may need minor surgery. SEEK MEDICAL CARE IF:  You have any concerns or worries during your pregnancy. It is better to call with your questions if you feel they cannot wait, rather than worry about them. SEEK IMMEDIATE MEDICAL CARE IF:   An unexplained oral temperature above 102 F (38.9 C) develops, or as your caregiver suggests.  You have leaking of fluid from the vagina (birth canal). If leaking membranes are suspected, take your temperature and inform your caregiver of this when you call.  There is vaginal spotting or bleeding. Notify your caregiver of the amount and how many pads are used.  You develop a bad smelling vaginal discharge with a change in the color.  You continue to feel sick to your stomach (nauseated) and have no relief from remedies suggested. You vomit blood or coffee ground-like materials.  You lose more than 2 pounds of weight in 1 week.  You gain more than 2 pounds of weight in 1 week and you notice swelling of your face, hands, feet, or legs.  You gain 5 pounds or more in 1 week (even if you do not have swelling of your hands, face, legs, or feet).  You get exposed to [place] measles and have never had them.  You are exposed to fifth disease or chickenpox.  You develop belly (abdominal) pain. Round ligament discomfort is a common non-cancerous (benign) cause of abdominal pain in pregnancy. Your caregiver still must evaluate this.  You develop headache, fever, diarrhea, pain with urination, or shortness of breath.  You  fall or are in a car accident or have any kind of trauma.  There is mental or physical violence in your home. Document Released: 02/06/2001 Document Revised: 05/07/2011 Document Reviewed: 08/10/2008 [facility] Patient Information 2013 [place], [place].  Morning Sickness Morning sickness is when you feel sick to your stomach (nauseous) during pregnancy. This nauseous feeling may or may not come with throwing up (vomiting). It often occurs in the morning, but can be a problem any time of day. While morning sickness is unpleasant, it is usually harmless unless you develop severe and continual vomiting (hyperemesis gravidarum). This condition requires more intense  treatment. CAUSES  The cause of morning sickness is not completely known but seems to be related to a sudden increase of two hormones:   Human chorionic gonadotropin (hCG).  Estrogen hormone. These are elevated in the first part of the pregnancy. TREATMENT  Do not use any medicines (prescription, over-the-counter, or herbal) for morning sickness without first talking to your caregiver. Some patients are helped by the following:  Vitamin B6 (25mg  every 8 hours) or vitamin B6 shots.  An antihistamine called doxylamine (10mg  every 8 hours).  The herbal medication ginger. HOME CARE INSTRUCTIONS   Taking multivitamins before getting pregnant can prevent or decrease the severity of morning sickness in most women.  Eat a piece of dry toast or unsalted crackers before getting out of bed in the morning.  Eat 5 or 6 small meals a day.  Eat dry and bland foods (rice, baked potato).  Do not drink liquids with your meals. Drink liquids between meals.  Avoid greasy, fatty, and spicy foods.  Get someone to cook for you if the smell of any food causes nausea and vomiting.  Avoid vitamin pills with iron because iron can cause nausea.  Snack on protein foods between meals if you are hungry.  Eat unsweetened gelatins for deserts.  Wear  an acupressure wristband (worn for sea sickness) may be helpful.  Acupuncture may be helpful.  Do not smoke.  Get a humidifier to keep the air in your house free of odors. SEEK MEDICAL CARE IF:   Your home remedies are not working and you need medication.  You feel dizzy or lightheaded.  You are losing weight.  You need help with your diet. SEEK IMMEDIATE MEDICAL CARE IF:   You have persistent and uncontrolled nausea and vomiting.  You pass out (faint).  You have a fever. MAKE SURE YOU:   Understand these instructions.  Will watch your condition.  Will get help right away if you are not doing well or get worse. Document Released: 04/05/2006 Document Revised: 05/07/2011 Document Reviewed: 01/31/2007 [facility] Patient Information 2013 [place], [place].  Heartburn During Pregnancy  Heartburn is a burning sensation in the chest caused by stomach acid backing up into the esophagus. Heartburn (also known as "reflux") is common in pregnancy because a certain hormone (progesterone) changes. The progesterone hormone may relax the valve that separates the esophagus from the stomach. This allows acid to go up into the esophagus, causing heartburn. Heartburn may also happen in pregnancy because the enlarging uterus pushes up on the stomach, which pushes more acid into the esophagus. This is especially true in the later stages of pregnancy. Heartburn problems usually go away after giving birth. CAUSES   The progesterone hormone.  Changing hormone levels.  The growing uterus that pushes stomach acid upward.  Large meals.  Certain foods and drinks.  Exercise.  Increased acid production. SYMPTOMS   Burning pain in the chest or lower throat.  Bitter taste in the mouth.  Coughing. DIAGNOSIS  Heartburn is typically diagnosed by your caregiver when taking a careful history of your concern. Your caregiver may order a blood test to check for a certain type of bacteria that is  associated with heartburn. Sometimes, heartburn is diagnosed by prescribing a heartburn medicine to see if the symptoms improve. It is rare in pregnancy to have a procedure called an endoscopy. This is when a tube with a light and a camera on the end is used to examine the esophagus and the stomach. TREATMENT  Your caregiver may tell you to use certain over-the-counter medicines (antacids, acid reducers) for mild heartburn.  Your caregiver may prescribe medicines to decrease stomach acid or to protect your stomach lining.  Your caregiver may recommend certain diet changes.  For severe cases, your caregiver may recommend that the head of the bed be elevated on blocks. (Sleeping with more pillows is not an effective treatment as it only changes the position of your head and does not improve the main problem of stomach acid refluxing into the esophagus.) HOME CARE INSTRUCTIONS   Take all medicines as directed by your caregiver.  Raise the head of your bed by putting blocks under the legs if instructed to by your caregiver.  Do not exercise right after eating.  Avoid eating 2 or 3 hours before bed. Do not lie down right after eating.  Eat small meals throughout the day instead of 3 large meals.  Identify foods and beverages that make your symptoms worse and avoid them. Foods you may want to avoid include:  Peppers.  Chocolate.  High-fat foods, including fried foods.  Spicy foods.  Garlic and onions.  Citrus fruits, including oranges, grapefruit, lemons, and limes.  Food containing tomatoes or tomato products.  Mint.  Carbonated and caffeinated drinks.  Vinegar. SEEK IMMEDIATE MEDICAL CARE IF:   You have severe chest pain that goes down your arm or into your jaw or neck.  You feel sweaty, dizzy, or lightheaded.  You become short of breath.  You vomit blood.  You have difficulty or pain with swallowing.  You have bloody or black, tarry stools.  You have episodes of  heartburn more than 3 times a week, for more than 2 weeks. MAKE SURE YOU:  Understand these instructions.  Will watch your condition.  Will get help right away if you are not doing well or get worse. Document Released: 02/10/2000 Document Revised: 05/07/2011 Document Reviewed: 08/03/2010 [facility] Patient Information 2013 [place], [place].

## 2011-11-29 NOTE — Progress Notes (Signed)
  Subjective:    Paige Bates is being seen today for her first obstetrical visit.  She was seen in [facility] on 9/21 with nausea and vomiting with pregnancy confirmed by bedside sono. Started on ranitidine, zofran, phenergan, robinul and omeprazole. Is taking ranitidine and phenergan. Robinul did not help spitting. Phenergan makes her have blurry vision and feel strange. This is a planned pregnancy. She is at [redacted]w[redacted]d gestation by LMP. Her obstetrical history is significant for preterm contractions at 7 months last pregnancy. Went on to deliver at term. Relationship with FOB: spouse, living together. Patient does intend to breast feed. Pregnancy history fully reviewed.  Patient reports fatigue, heartburn, nausea, vomiting, diarrhea, no cramping or bleeding.  Positive Hep B surface antigen in 2010. States this resolved and she was vaccinated for Hep B.   Review of Systems:   Review of Systems Contipation, diarrhea, nausea, vomiting, reflux, spitting, fever 103 yesterday. No cough, congestion or shortness of breath. No vaginal discharge. No dysuria, urgency or frequency.  Objective:     BP 119/74  Temp 100 F (37.8 C)  Ht 5' 8.11" (1.73 m)  Wt 211 lb 1.6 oz (95.754 kg)  BMI 31.99 kg/m2  LMP 09/24/2011 Physical Exam  Exam CONST:  Well nourished, well developed, no distress HEENT:  EOMI, conjunctiva normal NECK:  No thyromegaly, no tracheal deviation, no lymphadenopathy CV:  RRR, no murmur RESP:  CTAB Abd:  Soft, nontender, nondistended, normal bowel sounds. EXTREM: no edema, nontender, full ROM NEURO:  Alert and oriented, no focal deficits GU:  Normal vagina, no discharge, no odor, normal cervix, uterus size difficult to asess due to body habitus, no CMT, mild adnexal tenderness.   Assessment:    Pregnancy: [id number] Patient Active Problem List  Diagnosis  . HEPATITIS B  . OBESITY  History of preterm contractions with term delivery     Plan:    Initial labs drawn. Prenatal  vitamins. Problem list reviewed and updated. AFP3 discussed: requested. Requested early fetal screen. Will refer to [facility] for nuchal translucency. Role of ultrasound in pregnancy discussed; fetal survey: requested. Amniocentesis discussed: not indicated. Follow up in 4 weeks. 15% of 45 min visit spent on counseling and coordination of care.  Needs hemoglobin electrophoresis, early glucola. Recheck Hep B surface antigen - will get further studies if indicated.  Napoleon Form 11/29/2011

## 2011-11-30 ENCOUNTER — Encounter: Payer: Self-pay | Admitting: *Deleted

## 2011-11-30 DIAGNOSIS — O98519 Other viral diseases complicating pregnancy, unspecified trimester: Secondary | ICD-10-CM | POA: Insufficient documentation

## 2011-11-30 DIAGNOSIS — IMO0002 Reserved for concepts with insufficient information to code with codable children: Secondary | ICD-10-CM | POA: Insufficient documentation

## 2011-11-30 LAB — WET PREP, GENITAL

## 2011-11-30 LAB — OBSTETRIC PANEL
Antibody Screen: NEGATIVE
Basophils Absolute: 0 10*3/uL (ref 0.0–0.1)
Eosinophils Absolute: 0.3 10*3/uL (ref 0.0–0.7)
Eosinophils Relative: 5 % (ref 0–5)
HCT: 35.7 % — ABNORMAL LOW (ref 36.0–46.0)
Lymphs Abs: 1.4 10*3/uL (ref 0.7–4.0)
MCH: 25.9 pg — ABNORMAL LOW (ref 26.0–34.0)
MCV: 79.2 fL (ref 78.0–100.0)
Monocytes Absolute: 0.5 10*3/uL (ref 0.1–1.0)
Platelets: 265 10*3/uL (ref 150–400)
RDW: 15.7 % — ABNORMAL HIGH (ref 11.5–15.5)

## 2011-12-01 LAB — CULTURE, OB URINE
Colony Count: NO GROWTH
Organism ID, Bacteria: NO GROWTH

## 2011-12-05 ENCOUNTER — Other Ambulatory Visit: Payer: Self-pay | Admitting: Family Medicine

## 2011-12-05 ENCOUNTER — Other Ambulatory Visit: Payer: Self-pay | Admitting: Medical

## 2011-12-05 ENCOUNTER — Ambulatory Visit (HOSPITAL_COMMUNITY)
Admission: RE | Admit: 2011-12-05 | Discharge: 2011-12-05 | Disposition: A | Discharge status: Home / Self Care | Payer: Medicaid Other | Source: Ambulatory Visit | Attending: Family Medicine | Admitting: Family Medicine

## 2011-12-05 VITALS — BP 111/74 | HR 93 | Wt 210.5 lb

## 2011-12-05 DIAGNOSIS — O0990 Supervision of high risk pregnancy, unspecified, unspecified trimester: Secondary | ICD-10-CM

## 2011-12-05 DIAGNOSIS — Z3689 Encounter for other specified antenatal screening: Secondary | ICD-10-CM | POA: Insufficient documentation

## 2011-12-05 DIAGNOSIS — O98519 Other viral diseases complicating pregnancy, unspecified trimester: Secondary | ICD-10-CM

## 2011-12-05 DIAGNOSIS — IMO0002 Reserved for concepts with insufficient information to code with codable children: Secondary | ICD-10-CM

## 2011-12-05 NOTE — Progress Notes (Signed)
Paige Bates  was seen today for an ultrasound appointment.  See full report in [facility].  Alpha Gula, MD  Single IUP at 10 2/7 weeks Biometry consistent with dates  Recommend follow up in 2 weeks for first trimester screen. Anatomy scan at 18 weeks.

## 2011-12-10 ENCOUNTER — Telehealth: Payer: Self-pay | Admitting: *Deleted

## 2011-12-10 NOTE — Telephone Encounter (Signed)
Patient called and left a message stating  She has been out of work for 2 weeks and can go back to work, but needs form signed so she can go back to work- wanted to take care of it today.

## 2011-12-11 NOTE — Telephone Encounter (Signed)
Called pt and pt informed me that she needed a letter stating that she can go back to work because she has been out of work for 2 weeks and needs money.  Pt stated that she received the original letter of her staying at home from [facility] due to severe nausea and vomiting and the pt is a flight attendant.  I advised that she should make an appt to see a provider so that they could evaluate her and possible get the ok to go back to work.  Pt was scheduled for 12/12/11 @ 0915 for evaluation. Pt stated understanding and did not have further questions.

## 2011-12-12 ENCOUNTER — Encounter: Payer: Self-pay | Admitting: Advanced Practice Midwife

## 2011-12-12 ENCOUNTER — Ambulatory Visit (INDEPENDENT_AMBULATORY_CARE_PROVIDER_SITE_OTHER): Payer: Self-pay | Admitting: Advanced Practice Midwife

## 2011-12-12 VITALS — BP 122/60 | Temp 97.0°F | Wt 210.4 lb

## 2011-12-12 DIAGNOSIS — B1910 Unspecified viral hepatitis B without hepatic coma: Secondary | ICD-10-CM

## 2011-12-12 DIAGNOSIS — R768 Other specified abnormal immunological findings in serum: Secondary | ICD-10-CM

## 2011-12-12 DIAGNOSIS — R894 Abnormal immunological findings in specimens from other organs, systems and tissues: Secondary | ICD-10-CM

## 2011-12-12 DIAGNOSIS — IMO0002 Reserved for concepts with insufficient information to code with codable children: Secondary | ICD-10-CM

## 2011-12-12 DIAGNOSIS — Z3490 Encounter for supervision of normal pregnancy, unspecified, unspecified trimester: Secondary | ICD-10-CM

## 2011-12-12 LAB — COMPREHENSIVE METABOLIC PANEL
ALT: 14 U/L (ref 0–35)
BUN: 5 mg/dL — ABNORMAL LOW (ref 6–23)
CO2: 21 mEq/L (ref 19–32)
Calcium: 9.2 mg/dL (ref 8.4–10.5)
Chloride: 103 mEq/L (ref 96–112)
Creat: 0.51 mg/dL (ref 0.50–1.10)
Glucose, Bld: 98 mg/dL (ref 70–99)
Total Bilirubin: 0.3 mg/dL (ref 0.3–1.2)

## 2011-12-12 LAB — POCT URINALYSIS DIP (DEVICE)
Leukocytes, UA: NEGATIVE
Nitrite: NEGATIVE
Protein, ur: 30 mg/dL — AB

## 2011-12-12 NOTE — Progress Notes (Signed)
Pt here requesting release to return to work. Originally given note not to work d/t nausea and vomiting. States vomiting 1-2 x/day now rather than 5-6, still spitting. Not taking in adequate water. Elevated SG and 30 ketones on ua today, encouraged increased fluid intake. Not taking any antiemetics or robinul anymore - states robinul didn't help. Zofran gives her headaches and phenergan makes her sleepy. Also concerned about leaking urine with vomiting. Works as a Financial controller. Encouraged to take Zofran as needed while working,may take tylenol or motrin as directed on bottle PRN headache.   HBsAg +, patient states that it was positive before in 2010, resolved, then vaccinated. Hep B labs drawn today as well as hemoglobinopathy panel. Pt had u/s at [facility], has 1st trimester screen scheduled there.

## 2011-12-12 NOTE — Patient Instructions (Signed)
Morning Sickness       Morning sickness is when you feel sick to your stomach (nauseous) during pregnancy. You may feel sick to your stomach and throw up (vomit). You may feel sick in the morning, but you can feel this way any time of day. Some women feel very sick to their stomach and cannot stop throwing up (hyperemesis gravidarum).   HOME CARE   Take multivitamins as told by your doctor. Taking multivitamins before getting pregnant can stop or lessen the harshness of morning sickness.   Eat dry toast or unsalted crackers before getting out of bed.   Eat 5 to 6 small meals a day.   Eat dry and bland foods like rice and baked potatoes.   Do not drink liquids with meals. Drink between meals.   Do not eat greasy, fatty, or spicy foods.   Have someone cook for you if the smell of food causes you to feel sick or throw up.   Do not take vitamins with iron, or as told by your doctor.   Eat protein when you need a snack (nuts, yogurt, cheese).   Eat unsweetened gelatins for dessert.   Wear a bracelet used for sea sickness (acupressure wristband).   Go to a doctor that puts thin needles into certain body points (acupuncture) to improve how you feel.   Do not smoke.   Use a humidifier to keep the air in your house free of odors.  GET HELP RIGHT AWAY IF:   You feel very sick to your stomach and cannot stop throwing up.   You pass out (faint).   You have a fever.   You need medicine to feel better.   You feel dizzy or lightheaded.   You are losing weight.   You need help knowing what to eat and what not to eat.  MAKE SURE YOU:   Understand these instructions.   Will watch your condition.   Will get help right away if you are not doing well or get worse.  Document Released: 03/22/2004 Document Revised: 05/07/2011 Document Reviewed: 05/12/2009   [employer]® Patient Information ©2013 [employer].

## 2011-12-12 NOTE — Progress Notes (Signed)
P = 89 Pain/Pressure in lower abdomen and back

## 2011-12-13 LAB — HEPATITIS B CORE ANTIBODY, TOTAL: Hep B Core Total Ab: POSITIVE — AB

## 2011-12-13 LAB — HEPATITIS B E ANTIGEN: Hepatitis Be Antigen: NEGATIVE

## 2011-12-13 LAB — HEPATITIS B E ANTIBODY: Hepatitis Be Antibody: POSITIVE — AB

## 2011-12-14 LAB — HEMOGLOBINOPATHY EVALUATION
Hgb A2 Quant: 2.4 % (ref 2.2–3.2)
Hgb A: 97.3 % (ref 96.8–97.8)
Hgb F Quant: 0.3 % (ref 0.0–2.0)

## 2011-12-17 ENCOUNTER — Telehealth: Payer: Self-pay | Admitting: *Deleted

## 2011-12-17 LAB — HEPATITIS B DNA, ULTRAQUANTITATIVE, PCR: Hepatitis B DNA: 5749 IU/mL — ABNORMAL HIGH (ref <20)

## 2011-12-17 NOTE — Telephone Encounter (Signed)
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 Would really like to speak directly to the doctor.

## 2011-12-17 NOTE — Telephone Encounter (Signed)
Pt left message that she was cleared for work but is having some problems- pressure. She would like to speak with the doctor.

## 2011-12-18 ENCOUNTER — Inpatient Hospital Stay (HOSPITAL_COMMUNITY)
Admission: AD | Admit: 2011-12-18 | Discharge: 2011-12-18 | Disposition: A | Discharge status: Home / Self Care | Payer: Medicaid Other | Source: Ambulatory Visit | Attending: Obstetrics & Gynecology | Admitting: Obstetrics & Gynecology

## 2011-12-18 ENCOUNTER — Encounter (HOSPITAL_COMMUNITY): Payer: Self-pay

## 2011-12-18 ENCOUNTER — Inpatient Hospital Stay (HOSPITAL_COMMUNITY): Payer: Medicaid Other

## 2011-12-18 DIAGNOSIS — O98519 Other viral diseases complicating pregnancy, unspecified trimester: Secondary | ICD-10-CM

## 2011-12-18 DIAGNOSIS — M549 Dorsalgia, unspecified: Secondary | ICD-10-CM

## 2011-12-18 DIAGNOSIS — N949 Unspecified condition associated with female genital organs and menstrual cycle: Secondary | ICD-10-CM | POA: Insufficient documentation

## 2011-12-18 DIAGNOSIS — IMO0002 Reserved for concepts with insufficient information to code with codable children: Secondary | ICD-10-CM

## 2011-12-18 DIAGNOSIS — O99891 Other specified diseases and conditions complicating pregnancy: Secondary | ICD-10-CM | POA: Insufficient documentation

## 2011-12-18 DIAGNOSIS — O26899 Other specified pregnancy related conditions, unspecified trimester: Secondary | ICD-10-CM

## 2011-12-18 LAB — WET PREP, GENITAL
Clue Cells Wet Prep HPF POC: NONE SEEN
Trich, Wet Prep: NONE SEEN
Yeast Wet Prep HPF POC: NONE SEEN

## 2011-12-18 LAB — URINALYSIS, ROUTINE W REFLEX MICROSCOPIC
Bilirubin Urine: NEGATIVE
Glucose, UA: NEGATIVE mg/dL
Ketones, ur: NEGATIVE mg/dL
Protein, ur: NEGATIVE mg/dL
pH: 6.5 (ref 5.0–8.0)

## 2011-12-18 MED ORDER — ACETAMINOPHEN 500 MG PO TABS
1000.0000 mg | ORAL_TABLET | Freq: Once | ORAL | Status: AC
  Administered 2011-12-18: 1000 mg via ORAL
  Filled 2011-12-18: qty 2

## 2011-12-18 NOTE — [facility] Note (Signed)
Ongoing problem with back pain- has had it from the beginning. Was having pelvic pressure this weekend - none now.

## 2011-12-19 LAB — GC/CHLAMYDIA PROBE AMP, GENITAL
Chlamydia, DNA Probe: NEGATIVE
GC Probe Amp, Genital: NEGATIVE

## 2011-12-20 ENCOUNTER — Encounter: Payer: Self-pay | Admitting: Obstetrics & Gynecology

## 2011-12-24 ENCOUNTER — Ambulatory Visit (HOSPITAL_COMMUNITY)
Admission: RE | Admit: 2011-12-24 | Discharge: 2011-12-24 | Disposition: A | Discharge status: Home / Self Care | Payer: Medicaid Other | Source: Ambulatory Visit | Attending: Family Medicine | Admitting: Family Medicine

## 2011-12-24 ENCOUNTER — Other Ambulatory Visit: Payer: Self-pay

## 2011-12-24 VITALS — BP 107/62 | HR 86 | Wt 213.0 lb

## 2011-12-24 DIAGNOSIS — Z3689 Encounter for other specified antenatal screening: Secondary | ICD-10-CM | POA: Insufficient documentation

## 2011-12-24 DIAGNOSIS — IMO0002 Reserved for concepts with insufficient information to code with codable children: Secondary | ICD-10-CM

## 2011-12-24 DIAGNOSIS — O98519 Other viral diseases complicating pregnancy, unspecified trimester: Secondary | ICD-10-CM

## 2011-12-24 DIAGNOSIS — O3510X0 Maternal care for (suspected) chromosomal abnormality in fetus, unspecified, not applicable or unspecified: Secondary | ICD-10-CM | POA: Insufficient documentation

## 2011-12-24 DIAGNOSIS — O0990 Supervision of high risk pregnancy, unspecified, unspecified trimester: Secondary | ICD-10-CM

## 2011-12-24 DIAGNOSIS — O351XX0 Maternal care for (suspected) chromosomal abnormality in fetus, not applicable or unspecified: Secondary | ICD-10-CM | POA: Insufficient documentation

## 2011-12-24 NOTE — Progress Notes (Signed)
Paige Bates had an ultrasound appointment today.  Please see [facility] report for details.  Impression Mason Jim, intrauterine pregnancy at 13w 0d on first trimester evaluation with limited morphologic survey that is appropriate for the assigned gestational age. The nuchal translucency measures 1.8 mm, which is normal for the gestational age. The nasal bone is seen on today's evaluation and recorded as present on the risk assessment.   Recommendations Regardless of the results of today's evaluation, completion of the first trimester screen analytes is both recommended and arranged for your patient. I also recommend a second trimester AFP to occur around 15-[redacted] weeks gestational age. Anatomy scan has been previously scheduled to occur at around [redacted] weeks gestation.  Rogelia Boga, MD

## 2011-12-26 ENCOUNTER — Encounter: Payer: Self-pay | Admitting: Family Medicine

## 2011-12-26 ENCOUNTER — Telehealth: Payer: Self-pay | Admitting: *Deleted

## 2011-12-26 NOTE — Telephone Encounter (Signed)
Paige Bates called and left a message on voicemail stating she came in Monday or Tuesday to get a note from doctor to be out of work and that she saw Dr. Thad Ranger Tuesday. States she decided not to go back to work because of the pain. 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 Thad Ranger about it. States she needs a note to send to her boss by this weekend.

## 2011-12-27 ENCOUNTER — Encounter: Payer: Self-pay | Admitting: *Deleted

## 2011-12-27 DIAGNOSIS — O0990 Supervision of high risk pregnancy, unspecified, unspecified trimester: Secondary | ICD-10-CM | POA: Insufficient documentation

## 2011-12-27 NOTE — Telephone Encounter (Signed)
Called pt and informed her that her letter has been prepared as requested. She asked if I would fax it to [employer] @ [phone].  I agreed. Pt had no further questions.

## 2012-01-01 ENCOUNTER — Other Ambulatory Visit (INDEPENDENT_AMBULATORY_CARE_PROVIDER_SITE_OTHER): Payer: Self-pay

## 2012-01-01 DIAGNOSIS — Z111 Encounter for screening for respiratory tuberculosis: Secondary | ICD-10-CM

## 2012-01-01 MED ORDER — TUBERCULIN PPD 5 UNIT/0.1ML ID SOLN
5.0000 [IU] | Freq: Once | INTRADERMAL | Status: AC
  Administered 2012-01-01: 5 [IU] via INTRADERMAL

## 2012-01-01 MED ORDER — TUBERCULIN PPD 5 UNIT/0.1ML ID SOLN
5.0000 [IU] | Freq: Once | INTRADERMAL | Status: AC
  Filled 2012-01-01: qty 0.1

## 2012-01-01 NOTE — Progress Notes (Signed)
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

## 2012-01-03 ENCOUNTER — Ambulatory Visit (INDEPENDENT_AMBULATORY_CARE_PROVIDER_SITE_OTHER): Payer: Self-pay | Admitting: *Deleted

## 2012-01-03 ENCOUNTER — Encounter: Payer: Self-pay | Admitting: *Deleted

## 2012-01-03 DIAGNOSIS — Z111 Encounter for screening for respiratory tuberculosis: Secondary | ICD-10-CM

## 2012-01-03 NOTE — Progress Notes (Signed)
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 Pt is asymptomatic. Results reviewed with Dr. Jolayne Panther who stated that no further management was needed as patient did not have symptoms.  Patient also reported that she still has some pelvic pressure similar to that she discussed with Dr. Thad Ranger before. I offered her an appointment but she stated that she felt like she could wait till next appt. Pt has followup with Dr. Thad Ranger next Thursday 11/14. I advised patient that if her symptoms worsened or she develops bleeding she should call us or go to [facility]. Pt agrees with plan.

## 2012-01-03 NOTE — Progress Notes (Signed)
Entered in error

## 2012-01-10 ENCOUNTER — Encounter: Payer: Self-pay | Admitting: Family Medicine

## 2012-01-10 ENCOUNTER — Telehealth: Payer: Self-pay | Admitting: Family Medicine

## 2012-01-10 NOTE — Telephone Encounter (Signed)
Patient called to reschedule her appointment with Dr. Thad Ranger due to being in class. She stated her teacher would not let her make up the work. I informed the patient Dr. Thad Ranger would not be in the office again until December 26th. Patient stated she understood, and would have to wait until then.

## 2012-01-17 ENCOUNTER — Encounter: Payer: Self-pay | Admitting: Advanced Practice Midwife

## 2012-02-01 ENCOUNTER — Other Ambulatory Visit: Payer: Self-pay | Admitting: Advanced Practice Midwife

## 2012-02-01 DIAGNOSIS — B1910 Unspecified viral hepatitis B without hepatic coma: Secondary | ICD-10-CM

## 2012-02-04 ENCOUNTER — Ambulatory Visit (HOSPITAL_COMMUNITY)
Admission: RE | Admit: 2012-02-04 | Discharge: 2012-02-04 | Disposition: A | Discharge status: Home / Self Care | Payer: Medicaid Other | Source: Ambulatory Visit | Attending: Advanced Practice Midwife | Admitting: Advanced Practice Midwife

## 2012-02-04 VITALS — BP 113/65 | HR 123 | Wt 220.2 lb

## 2012-02-04 DIAGNOSIS — B1910 Unspecified viral hepatitis B without hepatic coma: Secondary | ICD-10-CM

## 2012-02-04 DIAGNOSIS — Z363 Encounter for antenatal screening for malformations: Secondary | ICD-10-CM | POA: Insufficient documentation

## 2012-02-04 DIAGNOSIS — IMO0002 Reserved for concepts with insufficient information to code with codable children: Secondary | ICD-10-CM

## 2012-02-04 DIAGNOSIS — O358XX0 Maternal care for other (suspected) fetal abnormality and damage, not applicable or unspecified: Secondary | ICD-10-CM | POA: Insufficient documentation

## 2012-02-04 DIAGNOSIS — Z1389 Encounter for screening for other disorder: Secondary | ICD-10-CM | POA: Insufficient documentation

## 2012-02-04 DIAGNOSIS — O98519 Other viral diseases complicating pregnancy, unspecified trimester: Secondary | ICD-10-CM

## 2012-02-05 ENCOUNTER — Encounter: Payer: Self-pay | Admitting: Advanced Practice Midwife

## 2012-02-07 ENCOUNTER — Telehealth: Payer: Self-pay | Admitting: *Deleted

## 2012-02-07 DIAGNOSIS — O98519 Other viral diseases complicating pregnancy, unspecified trimester: Secondary | ICD-10-CM

## 2012-02-07 DIAGNOSIS — IMO0002 Reserved for concepts with insufficient information to code with codable children: Secondary | ICD-10-CM

## 2012-02-07 NOTE — Telephone Encounter (Signed)
Message copied by Mannie Stabile on Thu Feb 07, 2012  2:18 PM ------      Message from: Dorathy Kinsman      Created: Tue Feb 05, 2012 10:04 PM       WOC pt. Cancelled several appts. Next appt 02/21/12, too late for AFP. Please call to see if she wants to have AFP drawn this week.

## 2012-02-07 NOTE — Telephone Encounter (Signed)
Spoke with patient. Upon reviewing her chart I also noticed that she had not been seen since late October, I advised that we make her an appointment to be seen for her OB followup, she will also have afp drawn at that time. Pt also stated that she had contractions, I advised that she be seen in [facility] if they are regular or she starts to have leaking fluid or watery discharge. Pt states that she agrees with plan.

## 2012-02-11 ENCOUNTER — Encounter: Payer: Self-pay | Admitting: Family Medicine

## 2012-02-11 ENCOUNTER — Ambulatory Visit (INDEPENDENT_AMBULATORY_CARE_PROVIDER_SITE_OTHER): Payer: Medicaid Other | Admitting: Family Medicine

## 2012-02-11 VITALS — BP 112/71 | Temp 99.3°F | Wt 222.0 lb

## 2012-02-11 DIAGNOSIS — O4700 False labor before 37 completed weeks of gestation, unspecified trimester: Secondary | ICD-10-CM

## 2012-02-11 DIAGNOSIS — O98519 Other viral diseases complicating pregnancy, unspecified trimester: Secondary | ICD-10-CM

## 2012-02-11 DIAGNOSIS — O0990 Supervision of high risk pregnancy, unspecified, unspecified trimester: Secondary | ICD-10-CM

## 2012-02-11 LAB — POCT URINALYSIS DIP (DEVICE)
Ketones, ur: NEGATIVE mg/dL
Leukocytes, UA: NEGATIVE
Protein, ur: NEGATIVE mg/dL
Urobilinogen, UA: 0.2 mg/dL (ref 0.0–1.0)
pH: 7.5 (ref 5.0–8.0)

## 2012-02-11 NOTE — Progress Notes (Signed)
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

## 2012-02-11 NOTE — Progress Notes (Signed)
Pulse 96 C/o Pelvic pain and pressure.

## 2012-02-11 NOTE — Assessment & Plan Note (Signed)
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

## 2012-02-11 NOTE — Patient Instructions (Signed)
Pregnancy - Second Trimester The second trimester of pregnancy (3 to 6 months) is a period of rapid growth for you and your baby. At the end of the sixth month, your baby is about 9 inches long and weighs 1 1/2 pounds. You will begin to feel the baby move between 18 and 20 weeks of the pregnancy. This is called quickening. Weight gain is faster. A clear fluid (colostrum) may leak out of your breasts. You may feel small contractions of the womb (uterus). This is known as false labor or Braxton-Hicks contractions. This is like a practice for labor when the baby is ready to be born. Usually, the problems with morning sickness have usually passed by the end of your first trimester. Some women develop small dark blotches (called cholasma, mask of pregnancy) on their face that usually goes away after the baby is born. Exposure to the sun makes the blotches worse. Acne may also develop in some pregnant women and pregnant women who have acne, may find that it goes away. PRENATAL EXAMS  Blood work may continue to be done during prenatal exams. These tests are done to check on your health and the probable health of your baby. Blood work is used to follow your blood levels (hemoglobin). Anemia (low hemoglobin) is common during pregnancy. Iron and vitamins are given to help prevent this. You will also be checked for diabetes between 24 and 28 weeks of the pregnancy. Some of the previous blood tests may be repeated.  The size of the uterus is measured during each visit. This is to make sure that the baby is continuing to grow properly according to the dates of the pregnancy.  Your blood pressure is checked every prenatal visit. This is to make sure you are not getting toxemia.  Your urine is checked to make sure you do not have an infection, diabetes or protein in the urine.  Your weight is checked often to make sure gains are happening at the suggested rate. This is to ensure that both you and your baby are  growing normally.  Sometimes, an ultrasound is performed to confirm the proper growth and development of the baby. This is a test which bounces harmless sound waves off the baby so your caregiver can more accurately determine due dates. Sometimes, a specialized test is done on the amniotic fluid surrounding the baby. This test is called an amniocentesis. The amniotic fluid is obtained by sticking a needle into the belly (abdomen). This is done to check the chromosomes in instances where there is a concern about possible genetic problems with the baby. It is also sometimes done near the end of pregnancy if an early delivery is required. In this case, it is done to help make sure the baby's lungs are mature enough for the baby to live outside of the womb. CHANGES OCCURING IN THE SECOND TRIMESTER OF PREGNANCY Your body goes through many changes during pregnancy. They vary from person to person. Talk to your caregiver about changes you notice that you are concerned about.  During the second trimester, you will likely have an increase in your appetite. It is normal to have cravings for certain foods. This varies from person to person and pregnancy to pregnancy.  Your lower abdomen will begin to bulge.  You may have to urinate more often because the uterus and baby are pressing on your bladder. It is also common to get more bladder infections during pregnancy (pain with urination). You can help this by   drinking lots of fluids and emptying your bladder before and after intercourse.  You may begin to get stretch marks on your hips, abdomen, and breasts. These are normal changes in the body during pregnancy. There are no exercises or medications to take that prevent this change.  You may begin to develop swollen and bulging veins (varicose veins) in your legs. Wearing support hose, elevating your feet for 15 minutes, 3 to 4 times a day and limiting salt in your diet helps lessen the problem.  Heartburn may  develop as the uterus grows and pushes up against the stomach. Antacids recommended by your caregiver helps with this problem. Also, eating smaller meals 4 to 5 times a day helps.  Constipation can be treated with a stool softener or adding bulk to your diet. Drinking lots of fluids, vegetables, fruits, and whole grains are helpful.  Exercising is also helpful. If you have been very active up until your pregnancy, most of these activities can be continued during your pregnancy. If you have been less active, it is helpful to start an exercise program such as walking.  Hemorrhoids (varicose veins in the rectum) may develop at the end of the second trimester. Warm sitz baths and hemorrhoid cream recommended by your caregiver helps hemorrhoid problems.  Backaches may develop during this time of your pregnancy. Avoid heavy lifting, wear low heal shoes and practice good posture to help with backache problems.  Some pregnant women develop tingling and numbness of their hand and fingers because of swelling and tightening of ligaments in the wrist (carpel tunnel syndrome). This goes away after the baby is born.  As your breasts enlarge, you may have to get a bigger bra. Get a comfortable, cotton, support bra. Do not get a nursing bra until the last month of the pregnancy if you will be nursing the baby.  You may get a dark line from your belly button to the pubic area called the linea nigra.  You may develop rosy cheeks because of increase blood flow to the face.  You may develop spider looking lines of the face, neck, arms and chest. These go away after the baby is born. HOME CARE INSTRUCTIONS   It is extremely important to avoid all smoking, herbs, alcohol, and unprescribed drugs during your pregnancy. These chemicals affect the formation and growth of the baby. Avoid these chemicals throughout the pregnancy to ensure the delivery of a healthy infant.  Most of your home care instructions are the same  as suggested for the first trimester of your pregnancy. Keep your caregiver's appointments. Follow your caregiver's instructions regarding medication use, exercise and diet.  During pregnancy, you are providing food for you and your baby. Continue to eat regular, well-balanced meals. Choose foods such as meat, fish, milk and other low fat dairy products, vegetables, fruits, and whole-grain breads and cereals. Your caregiver will tell you of the ideal weight gain.  A physical sexual relationship may be continued up until near the end of pregnancy if there are no other problems. Problems could include early (premature) leaking of amniotic fluid from the membranes, vaginal bleeding, abdominal pain, or other medical or pregnancy problems.  Exercise regularly if there are no restrictions. Check with your caregiver if you are unsure of the safety of some of your exercises. The greatest weight gain will occur in the last 2 trimesters of pregnancy. Exercise will help you:  Control your weight.  Get you in shape for labor and delivery.  Lose weight   after you have the baby.  Wear a good support or jogging bra for breast tenderness during pregnancy. This may help if worn during sleep. Pads or tissues may be used in the bra if you are leaking colostrum.  Do not use hot tubs, steam rooms or saunas throughout the pregnancy.  Wear your seat belt at all times when driving. This protects you and your baby if you are in an accident.  Avoid raw meat, uncooked cheese, cat litter boxes and soil used by cats. These carry germs that can cause birth defects in the baby.  The second trimester is also a good time to visit your dentist for your dental health if this has not been done yet. Getting your teeth cleaned is OK. Use a soft toothbrush. Brush gently during pregnancy.  It is easier to loose urine during pregnancy. Tightening up and strengthening the pelvic muscles will help with this problem. Practice stopping  your urination while you are going to the bathroom. These are the same muscles you need to strengthen. It is also the muscles you would use as if you were trying to stop from passing gas. You can practice tightening these muscles up 10 times a set and repeating this about 3 times per day. Once you know what muscles to tighten up, do not perform these exercises during urination. It is more likely to contribute to an infection by backing up the urine.  Ask for help if you have financial, counseling or nutritional needs during pregnancy. Your caregiver will be able to offer counseling for these needs as well as refer you for other special needs.  Your skin may become oily. If so, wash your face with mild soap, use non-greasy moisturizer and oil or cream based makeup. MEDICATIONS AND DRUG USE IN PREGNANCY  Take prenatal vitamins as directed. The vitamin should contain 1 milligram of folic acid. Keep all vitamins out of reach of children. Only a couple vitamins or tablets containing iron may be fatal to a baby or young child when ingested.  Avoid use of all medications, including herbs, over-the-counter medications, not prescribed or suggested by your caregiver. Only take over-the-counter or prescription medicines for pain, discomfort, or fever as directed by your caregiver. Do not use aspirin.  Let your caregiver also know about herbs you may be using.  Alcohol is related to a number of birth defects. This includes fetal alcohol syndrome. All alcohol, in any form, should be avoided completely. Smoking will cause low birth rate and premature babies.  Street or illegal drugs are very harmful to the baby. They are absolutely forbidden. A baby born to an addicted mother will be addicted at birth. The baby will go through the same withdrawal an adult does. SEEK MEDICAL CARE IF:  You have any concerns or worries during your pregnancy. It is better to call with your questions if you feel they cannot wait,  rather than worry about them. SEEK IMMEDIATE MEDICAL CARE IF:   An unexplained oral temperature above 102 F (38.9 C) develops, or as your caregiver suggests.  You have leaking of fluid from the vagina (birth canal). If leaking membranes are suspected, take your temperature and tell your caregiver of this when you call.  There is vaginal spotting, bleeding, or passing clots. Tell your caregiver of the amount and how many pads are used. Light spotting in pregnancy is common, especially following intercourse.  You develop a bad smelling vaginal discharge with a change in the color from clear   to white.  You continue to feel sick to your stomach (nauseated) and have no relief from remedies suggested. You vomit blood or coffee ground-like materials.  You lose more than 2 pounds of weight or gain more than 2 pounds of weight over 1 week, or as suggested by your caregiver.  You notice swelling of your face, hands, feet, or legs.  You get exposed to German measles and have never had them.  You are exposed to fifth disease or chickenpox.  You develop belly (abdominal) pain. Round ligament discomfort is a common non-cancerous (benign) cause of abdominal pain in pregnancy. Your caregiver still must evaluate you.  You develop a bad headache that does not go away.  You develop fever, diarrhea, pain with urination, or shortness of breath.  You develop visual problems, blurry, or double vision.  You fall or are in a car accident or any kind of trauma.  There is mental or physical violence at home. Document Released: 02/06/2001 Document Revised: 05/07/2011 Document Reviewed: 08/11/2008 Exit[employer] Patient Information 2013 [employer].  Breastfeeding Deciding to breastfeed is one of the best choices you can make for you and your baby. The information that follows gives a brief overview of the benefits of breastfeeding as well as common topics surrounding breastfeeding. BENEFITS OF  BREASTFEEDING For the baby  The first milk (colostrum) helps the baby's digestive system function better.   There are antibodies in the mother's milk that help the baby fight off infections.   The baby has a lower incidence of asthma, allergies, and sudden infant death syndrome (SIDS).   The nutrients in breast milk are better for the baby than infant formulas, and breast milk helps the baby's brain grow better.   Babies who breastfeed have less gas, colic, and constipation.  For the mother  Breastfeeding helps develop a very special bond between the mother and her baby.   Breastfeeding is convenient, always available at the correct temperature, and costs nothing.   Breastfeeding burns calories in the mother and helps her lose weight that was gained during pregnancy.   Breastfeeding makes the uterus contract back down to normal size faster and slows bleeding following delivery.   Breastfeeding mothers have a lower risk of developing breast cancer.  BREASTFEEDING FREQUENCY  A healthy, full-term baby may breastfeed as often as every hour or space his or her feedings to every 3 hours.   Watch your baby for signs of hunger. Nurse your baby if he or she shows signs of hunger. How often you nurse will vary from baby to baby.   Nurse as often as the baby requests, or when you feel the need to reduce the fullness of your breasts.   Awaken the baby if it has been 3 4 hours since the last feeding.   Frequent feeding will help the mother make more milk and will help prevent problems, such as sore nipples and engorgement of the breasts.  BABY'S POSITION AT THE BREAST  Whether lying down or sitting, be sure that the baby's tummy is facing your tummy.   Support the breast with 4 fingers underneath the breast and the thumb above. Make sure your fingers are well away from the nipple and baby's mouth.   Stroke the baby's lips gently with your finger or nipple.   When the  baby's mouth is open wide enough, place all of your nipple and as much of the areola as possible into your baby's mouth.   Pull the baby in   close so the tip of the nose and the baby's cheeks touch the breast during the feeding.  FEEDINGS AND SUCTION  The length of each feeding varies from baby to baby and from feeding to feeding.   The baby must suck about 2 3 minutes for your milk to get to him or her. This is called a "let down." For this reason, allow the baby to feed on each breast as long as he or she wants. Your baby will end the feeding when he or she has received the right balance of nutrients.   To break the suction, put your finger into the corner of the baby's mouth and slide it between his or her gums before removing your breast from his or her mouth. This will help prevent sore nipples.  HOW TO TELL WHETHER YOUR BABY IS GETTING ENOUGH BREAST MILK. Wondering whether or not your baby is getting enough milk is a common concern among mothers. You can be assured that your baby is getting enough milk if:   Your baby is actively sucking and you hear swallowing.   Your baby seems relaxed and satisfied after a feeding.   Your baby nurses at least 8 12 times in a 24 hour time period. Nurse your baby until he or she unlatches or falls asleep at the first breast (at least 10 20 minutes), then offer the second side.   Your baby is wetting 5 6 disposable diapers (6 8 cloth diapers) in a 24 hour period by 5 6 days of age.   Your baby is having at least 3 4 stools every 24 hours for the first 6 weeks. The stool should be soft and yellow.   Your baby should gain 4 7 ounces per week after he or she is 4 days old.   Your breasts feel softer after nursing.  REDUCING BREAST ENGORGEMENT  In the first week after your baby is born, you may experience signs of breast engorgement. When breasts are engorged, they feel heavy, warm, full, and may be tender to the touch. You can reduce  engorgement if you:   Nurse frequently, every 2 3 hours. Mothers who breastfeed early and often have fewer problems with engorgement.   Place light ice packs on your breasts for 10 20 minutes between feedings. This reduces swelling. Wrap the ice packs in a lightweight towel to protect your skin. Bags of frozen vegetables work well for this purpose.   Take a warm shower or apply warm, moist heat to your breast for 5 10 minutes just before each feeding. This increases circulation and helps the milk flow.   Gently massage your breast before and during the feeding. Using your finger tips, massage from the chest wall towards your nipple in a circular motion.   Make sure that the baby empties at least one breast at every feeding before switching sides.   Use a breast pump to empty the breasts if your baby is sleepy or not nursing well. You may also want to pump if you are returning to work oryou feel you are getting engorged.   Avoid bottle feeds, pacifiers, or supplemental feedings of water or juice in place of breastfeeding. Breast milk is all the food your baby needs. It is not necessary for your baby to have water or formula. In fact, to help your breasts make more milk, it is best not to give your baby supplemental feedings during the early weeks.   Be sure the baby is latched   on and positioned properly while breastfeeding.   Wear a supportive bra, avoiding underwire styles.   Eat a balanced diet with enough fluids.   Rest often, relax, and take your prenatal vitamins to prevent fatigue, stress, and anemia.  If you follow these suggestions, your engorgement should improve in 24 48 hours. If you are still experiencing difficulty, call your lactation consultant or caregiver.  CARING FOR YOURSELF Take care of your breasts  Bathe or shower daily.   Avoid using soap on your nipples.   Start feedings on your left breast at one feeding and on your right breast at the next  feeding.   You will notice an increase in your milk supply 2 5 days after delivery. You may feel some discomfort from engorgement, which makes your breasts very firm and often tender. Engorgement "peaks" out within 24 48 hours. In the meantime, apply warm moist towels to your breasts for 5 10 minutes before feeding. Gentle massage and expression of some milk before feeding will soften your breasts, making it easier for your baby to latch on.   Wear a well-fitting nursing bra, and air dry your nipples for a 3 4minutes after each feeding.   Only use cotton bra pads.   Only use pure lanolin on your nipples after nursing. You do not need to wash it off before feeding the baby again. Another option is to express a few drops of breast milk and gently massage it into your nipples.  Take care of yourself  Eat well-balanced meals and nutritious snacks.   Drinking milk, fruit juice, and water to satisfy your thirst (about 8 glasses a day).   Get plenty of rest.  Avoid foods that you notice affect the baby in a bad way.  SEEK MEDICAL CARE IF:   You have difficulty with breastfeeding and need help.   You have a hard, red, sore area on your breast that is accompanied by a fever.   Your baby is too sleepy to eat well or is having trouble sleeping.   Your baby is wetting less than 6 diapers a day, by 5 days of age.   Your baby's skin or white part of his or her eyes is more yellow than it was in the hospital.   You feel depressed.  Document Released: 02/12/2005 Document Revised: 08/14/2011 Document Reviewed: 05/13/2011 [employer] Patient Information 2013 [employer].  

## 2012-02-21 ENCOUNTER — Encounter: Payer: Self-pay | Admitting: Family Medicine

## 2012-02-27 NOTE — L&D Delivery Note (Signed)
  Delivery Note At 10:51 AM a viable female was delivered via Vaginal, Spontaneous Delivery (Presentation: Right Occiput Anterior).  APGAR: 9, 9; weight .   Placenta status: Intact, Spontaneous.  Cord: 3 vessels with the following complications: None.   Anesthesia: Epidural  Episiotomy: None Lacerations: 2nd degree Suture Repair: 3.0 monocryl Est. Blood Loss (mL):   Mom to postpartum.  Baby to nursery-stable.  [name] H 06/22/2012, 11:31 AM

## 2012-02-28 ENCOUNTER — Encounter: Payer: Self-pay | Admitting: *Deleted

## 2012-03-10 ENCOUNTER — Ambulatory Visit (INDEPENDENT_AMBULATORY_CARE_PROVIDER_SITE_OTHER): Payer: Medicaid Other | Admitting: Family Medicine

## 2012-03-10 VITALS — BP 104/70 | Temp 97.0°F | Ht 66.75 in | Wt 224.8 lb

## 2012-03-10 DIAGNOSIS — O4700 False labor before 37 completed weeks of gestation, unspecified trimester: Secondary | ICD-10-CM

## 2012-03-10 LAB — POCT URINALYSIS DIP (DEVICE)
Glucose, UA: NEGATIVE mg/dL
Hgb urine dipstick: NEGATIVE
Nitrite: NEGATIVE
Protein, ur: NEGATIVE mg/dL
Specific Gravity, Urine: 1.02 (ref 1.005–1.030)
Urobilinogen, UA: 0.2 mg/dL (ref 0.0–1.0)

## 2012-03-10 MED ORDER — LANSOPRAZOLE 15 MG PO CPDR: 15.0000 mg | DELAYED_RELEASE_CAPSULE | Freq: Every day | ORAL | Status: DC

## 2012-03-10 NOTE — Progress Notes (Signed)
Pulse 103 Patient reports vaginal pain with some mild pressure and occasional contractions

## 2012-03-10 NOTE — Progress Notes (Signed)
Nutrition note: 1st nutrition visit Pt has h/o obesity. Pt has gained 19.8# @ [redacted]w[redacted]d, which is > expected. Pt reports eating 2 meals & snacks only some days due to low appetite. Pt is taking PNV. Pt reports no N&V but has a lot of heartburn. NKFA. Pt is not walking or doing any physical activity. Pt received verbal & written education on general nutrition during pregnancy. Encouraged small frequent meals & increasing physical activity (such as walking an extra loop around the grocery store).  Disc wt gain goals of 11-20# or 0.5#/wk.  Pt agrees to continue PNV & try to decrease portion sizes. Pt receives [facility] & plans to BF. F/u in 4-6 wks Blondell Reveal, MS, RD, LDN

## 2012-03-10 NOTE — Patient Instructions (Signed)
Take reflux medication (Prevacid/Lansoprazole) every day.   Heartburn During Pregnancy  Heartburn is a burning sensation in the chest caused by stomach acid backing up into the esophagus. Heartburn (also known as "reflux") is common in pregnancy because a certain hormone (progesterone) changes. The progesterone hormone may relax the valve that separates the esophagus from the stomach. This allows acid to go up into the esophagus, causing heartburn. Heartburn may also happen in pregnancy because the enlarging uterus pushes up on the stomach, which pushes more acid into the esophagus. This is especially true in the later stages of pregnancy. Heartburn problems usually go away after giving birth. CAUSES   The progesterone hormone.  Changing hormone levels.  The growing uterus that pushes stomach acid upward.  Large meals.  Certain foods and drinks.  Exercise.  Increased acid production. SYMPTOMS   Burning pain in the chest or lower throat.  Bitter taste in the mouth.  Coughing. DIAGNOSIS  Heartburn is typically diagnosed by your caregiver when taking a careful history of your concern. Your caregiver may order a blood test to check for a certain type of bacteria that is associated with heartburn. Sometimes, heartburn is diagnosed by prescribing a heartburn medicine to see if the symptoms improve. It is rare in pregnancy to have a procedure called an endoscopy. This is when a tube with a light and a camera on the end is used to examine the esophagus and the stomach. TREATMENT   Your caregiver may tell you to use certain over-the-counter medicines (antacids, acid reducers) for mild heartburn.  Your caregiver may prescribe medicines to decrease stomach acid or to protect your stomach lining.  Your caregiver may recommend certain diet changes.  For severe cases, your caregiver may recommend that the head of the bed be elevated on blocks. (Sleeping with more pillows is not an effective  treatment as it only changes the position of your head and does not improve the main problem of stomach acid refluxing into the esophagus.) HOME CARE INSTRUCTIONS   Take all medicines as directed by your caregiver.  Raise the head of your bed by putting blocks under the legs if instructed to by your caregiver.  Do not exercise right after eating.  Avoid eating 2 or 3 hours before bed. Do not lie down right after eating.  Eat small meals throughout the day instead of 3 large meals.  Identify foods and beverages that make your symptoms worse and avoid them. Foods you may want to avoid include:  Peppers.  Chocolate.  High-fat foods, including fried foods.  Spicy foods.  Garlic and onions.  Citrus fruits, including oranges, grapefruit, lemons, and limes.  Food containing tomatoes or tomato products.  Mint.  Carbonated and caffeinated drinks.  Vinegar. SEEK IMMEDIATE MEDICAL CARE IF:   You have severe chest pain that goes down your arm or into your jaw or neck.  You feel sweaty, dizzy, or lightheaded.  You become short of breath.  You vomit blood.  You have difficulty or pain with swallowing.  You have bloody or black, tarry stools.  You have episodes of heartburn more than 3 times a week, for more than 2 weeks. MAKE SURE YOU:  Understand these instructions.  Will watch your condition.  Will get help right away if you are not doing well or get worse. Document Released: 02/10/2000 Document Revised: 05/07/2011 Document Reviewed: 08/03/2010 [facility] Patient Information 2013 [place], [place].  Pregnancy - Second Trimester The second trimester of pregnancy (3 to 6 months)  is a period of rapid growth for you and your baby. At the end of the sixth month, your baby is about 9 inches long and weighs 1 1/2 pounds. You will begin to feel the baby move between 18 and 20 weeks of the pregnancy. This is called quickening. Weight gain is faster. A clear fluid (colostrum) may  leak out of your breasts. You may feel small contractions of the womb (uterus). This is known as false labor or Braxton-Hicks contractions. This is like a practice for labor when the baby is ready to be born. Usually, the problems with morning sickness have usually passed by the end of your first trimester. Some women develop small dark blotches (called cholasma, mask of pregnancy) on their face that usually goes away after the baby is born. Exposure to the sun makes the blotches worse. Acne may also develop in some pregnant women and pregnant women who have acne, may find that it goes away. PRENATAL EXAMS  Blood work may continue to be done during prenatal exams. These tests are done to check on your health and the probable health of your baby. Blood work is used to follow your blood levels (hemoglobin). Anemia (low hemoglobin) is common during pregnancy. Iron and vitamins are given to help prevent this. You will also be checked for diabetes between 24 and 28 weeks of the pregnancy. Some of the previous blood tests may be repeated.  The size of the uterus is measured during each visit. This is to make sure that the baby is continuing to grow properly according to the dates of the pregnancy.  Your blood pressure is checked every prenatal visit. This is to make sure you are not getting toxemia.  Your urine is checked to make sure you do not have an infection, diabetes or protein in the urine.  Your weight is checked often to make sure gains are happening at the suggested rate. This is to ensure that both you and your baby are growing normally.  Sometimes, an ultrasound is performed to confirm the proper growth and development of the baby. This is a test which bounces harmless sound waves off the baby so your caregiver can more accurately determine due dates. Sometimes, a specialized test is done on the amniotic fluid surrounding the baby. This test is called an amniocentesis. The amniotic fluid is  obtained by sticking a needle into the belly (abdomen). This is done to check the chromosomes in instances where there is a concern about possible genetic problems with the baby. It is also sometimes done near the end of pregnancy if an early delivery is required. In this case, it is done to help make sure the baby's lungs are mature enough for the baby to live outside of the womb. CHANGES OCCURING IN THE SECOND TRIMESTER OF PREGNANCY Your body goes through many changes during pregnancy. They vary from person to person. Talk to your caregiver about changes you notice that you are concerned about.  During the second trimester, you will likely have an increase in your appetite. It is normal to have cravings for certain foods. This varies from person to person and pregnancy to pregnancy.  Your lower abdomen will begin to bulge.  You may have to urinate more often because the uterus and baby are pressing on your bladder. It is also common to get more bladder infections during pregnancy (pain with urination). You can help this by drinking lots of fluids and emptying your bladder before and after intercourse.  You may begin to get stretch marks on your hips, abdomen, and breasts. These are normal changes in the body during pregnancy. There are no exercises or medications to take that prevent this change.  You may begin to develop swollen and bulging veins (varicose veins) in your legs. Wearing support hose, elevating your feet for 15 minutes, 3 to 4 times a day and limiting salt in your diet helps lessen the problem.  Heartburn may develop as the uterus grows and pushes up against the stomach. Antacids recommended by your caregiver helps with this problem. Also, eating smaller meals 4 to 5 times a day helps.  Constipation can be treated with a stool softener or adding bulk to your diet. Drinking lots of fluids, vegetables, fruits, and whole grains are helpful.  Exercising is also helpful. If you have  been very active up until your pregnancy, most of these activities can be continued during your pregnancy. If you have been less active, it is helpful to start an exercise program such as walking.  Hemorrhoids (varicose veins in the rectum) may develop at the end of the second trimester. Warm sitz baths and hemorrhoid cream recommended by your caregiver helps hemorrhoid problems.  Backaches may develop during this time of your pregnancy. Avoid heavy lifting, wear low heal shoes and practice good posture to help with backache problems.  Some pregnant women develop tingling and numbness of their hand and fingers because of swelling and tightening of ligaments in the wrist (carpel tunnel syndrome). This goes away after the baby is born.  As your breasts enlarge, you may have to get a bigger bra. Get a comfortable, cotton, support bra. Do not get a nursing bra until the last month of the pregnancy if you will be nursing the baby.  You may get a dark line from your belly button to the pubic area called the linea nigra.  You may develop rosy cheeks because of increase blood flow to the face.  You may develop spider looking lines of the face, neck, arms and chest. These go away after the baby is born. HOME CARE INSTRUCTIONS   It is extremely important to avoid all smoking, herbs, alcohol, and unprescribed drugs during your pregnancy. These chemicals affect the formation and growth of the baby. Avoid these chemicals throughout the pregnancy to ensure the delivery of a healthy infant.  Most of your home care instructions are the same as suggested for the first trimester of your pregnancy. Keep your caregiver's appointments. Follow your caregiver's instructions regarding medication use, exercise and diet.  During pregnancy, you are providing food for you and your baby. Continue to eat regular, well-balanced meals. Choose foods such as meat, fish, milk and other low fat dairy products, vegetables, fruits,  and whole-grain breads and cereals. Your caregiver will tell you of the ideal weight gain.  A physical sexual relationship may be continued up until near the end of pregnancy if there are no other problems. Problems could include early (premature) leaking of amniotic fluid from the membranes, vaginal bleeding, abdominal pain, or other medical or pregnancy problems.  Exercise regularly if there are no restrictions. Check with your caregiver if you are unsure of the safety of some of your exercises. The greatest weight gain will occur in the last 2 trimesters of pregnancy. Exercise will help you:  Control your weight.  Get you in shape for labor and delivery.  Lose weight after you have the baby.  Wear a good support or jogging bra  for breast tenderness during pregnancy. This may help if worn during sleep. Pads or tissues may be used in the bra if you are leaking colostrum.  Do not use hot tubs, steam rooms or saunas throughout the pregnancy.  Wear your seat belt at all times when driving. This protects you and your baby if you are in an accident.  Avoid raw meat, uncooked cheese, cat litter boxes and soil used by cats. These carry germs that can cause birth defects in the baby.  The second trimester is also a good time to visit your dentist for your dental health if this has not been done yet. Getting your teeth cleaned is OK. Use a soft toothbrush. Brush gently during pregnancy.  It is easier to loose urine during pregnancy. Tightening up and strengthening the pelvic muscles will help with this problem. Practice stopping your urination while you are going to the bathroom. These are the same muscles you need to strengthen. It is also the muscles you would use as if you were trying to stop from passing gas. You can practice tightening these muscles up 10 times a set and repeating this about 3 times per day. Once you know what muscles to tighten up, do not perform these exercises during urination.  It is more likely to contribute to an infection by backing up the urine.  Ask for help if you have financial, counseling or nutritional needs during pregnancy. Your caregiver will be able to offer counseling for these needs as well as refer you for other special needs.  Your skin may become oily. If so, wash your face with mild soap, use non-greasy moisturizer and oil or cream based makeup. MEDICATIONS AND DRUG USE IN PREGNANCY  Take prenatal vitamins as directed. The vitamin should contain 1 milligram of folic acid. Keep all vitamins out of reach of children. Only a couple vitamins or tablets containing iron may be fatal to a baby or young child when ingested.  Avoid use of all medications, including herbs, over-the-counter medications, not prescribed or suggested by your caregiver. Only take over-the-counter or prescription medicines for pain, discomfort, or fever as directed by your caregiver. Do not use aspirin.  Let your caregiver also know about herbs you may be using.  Alcohol is related to a number of birth defects. This includes fetal alcohol syndrome. All alcohol, in any form, should be avoided completely. Smoking will cause low birth rate and premature babies.  Street or illegal drugs are very harmful to the baby. They are absolutely forbidden. A baby born to an addicted mother will be addicted at birth. The baby will go through the same withdrawal an adult does. SEEK MEDICAL CARE IF:  You have any concerns or worries during your pregnancy. It is better to call with your questions if you feel they cannot wait, rather than worry about them. SEEK IMMEDIATE MEDICAL CARE IF:   An unexplained oral temperature above 102 F (38.9 C) develops, or as your caregiver suggests.  You have leaking of fluid from the vagina (birth canal). If leaking membranes are suspected, take your temperature and tell your caregiver of this when you call.  There is vaginal spotting, bleeding, or passing clots.  Tell your caregiver of the amount and how many pads are used. Light spotting in pregnancy is common, especially following intercourse.  You develop a bad smelling vaginal discharge with a change in the color from clear to white.  You continue to feel sick to your stomach (nauseated) and  have no relief from remedies suggested. You vomit blood or coffee ground-like materials.  You lose more than 2 pounds of weight or gain more than 2 pounds of weight over 1 week, or as suggested by your caregiver.  You notice swelling of your face, hands, feet, or legs.  You get exposed to [place] measles and have never had them.  You are exposed to fifth disease or chickenpox.  You develop belly (abdominal) pain. Round ligament discomfort is a common non-cancerous (benign) cause of abdominal pain in pregnancy. Your caregiver still must evaluate you.  You develop a bad headache that does not go away.  You develop fever, diarrhea, pain with urination, or shortness of breath.  You develop visual problems, blurry, or double vision.  You fall or are in a car accident or any kind of trauma.  There is mental or physical violence at home. Document Released: 02/06/2001 Document Revised: 05/07/2011 Document Reviewed: 08/11/2008 [facility] Patient Information 2013 [place], [place].

## 2012-03-10 NOTE — Progress Notes (Signed)
Having bad heartburn. On Prevacid but only taking prn. Pt told to take daily. Discussed other anti-reflux measures. Also c/o back pain and pressure, improving since stopped working. No bleeding or LOF.

## 2012-03-11 ENCOUNTER — Encounter: Payer: Self-pay | Admitting: *Deleted

## 2012-03-12 NOTE — Progress Notes (Unsigned)
Signed BTL Paperwork 03/10/12

## 2012-03-19 NOTE — Telephone Encounter (Signed)
Unable to reach patient by phone 

## 2012-04-24 ENCOUNTER — Other Ambulatory Visit: Payer: Self-pay | Admitting: Obstetrics & Gynecology

## 2012-04-24 ENCOUNTER — Ambulatory Visit (INDEPENDENT_AMBULATORY_CARE_PROVIDER_SITE_OTHER): Payer: Medicaid Other | Admitting: Obstetrics & Gynecology

## 2012-04-24 VITALS — BP 117/71 | Temp 97.3°F | Wt 225.1 lb

## 2012-04-24 DIAGNOSIS — K219 Gastro-esophageal reflux disease without esophagitis: Secondary | ICD-10-CM | POA: Insufficient documentation

## 2012-04-24 DIAGNOSIS — O0993 Supervision of high risk pregnancy, unspecified, third trimester: Secondary | ICD-10-CM

## 2012-04-24 DIAGNOSIS — IMO0002 Reserved for concepts with insufficient information to code with codable children: Secondary | ICD-10-CM

## 2012-04-24 LAB — POCT URINALYSIS DIP (DEVICE)
Bilirubin Urine: NEGATIVE
Glucose, UA: NEGATIVE mg/dL
Specific Gravity, Urine: 1.02 (ref 1.005–1.030)
Urobilinogen, UA: 1 mg/dL (ref 0.0–1.0)

## 2012-04-24 MED ORDER — LANSOPRAZOLE 15 MG PO CPDR
30.0000 mg | DELAYED_RELEASE_CAPSULE | Freq: Every day | ORAL | Status: DC
Start: 2012-04-24 — End: 2012-05-08

## 2012-04-24 NOTE — Progress Notes (Signed)
Pulse- 95  Pain/pressure-pelvic pressure/vaginal

## 2012-04-24 NOTE — Progress Notes (Signed)
Some irregular contractions

## 2012-04-24 NOTE — Patient Instructions (Signed)
Preterm Birth Preterm birth is a birth which happens before 37 weeks of pregnancy. Most pregnancies last about 39 to 41 weeks. Every week in the womb is important and is beneficial to the health of the infant. Infants born before 37 weeks of pregnancy are at a higher risk for complications. Depending on when the infant was born, he or she may be:  Late preterm. Born between 32 and 37 weeks of pregnancy.  Very preterm. Born at less than 32 weeks of pregnancy.  Extremely preterm. Born at less than 25 weeks of pregnancy. The earlier a baby is born, the more likely the child will have issues related to prematurity. Complications and problems that can be seen in infants born too early include:  Problems breathing (respiratory distress syndrome).  Low birth weight.  Problems feeding.  Sleeping problems.  Yellowing of the skin (jaundice).  Infections such as pneumonia. Babies that are born very preterm or extremely preterm are at risk for more serious medical issues. These include:  Breathing issues.  Eyesight issues.  Brain development issues (intraventricular hemorrhage).  Behavioral and emotional development issues.  Growth and developmental delays.  Cerebral palsy.  Serious feeding or bowel complications (necrotizing enterocolitis). CAUSES  There are 2 broad categories of preterm birth.  Spontaneous preterm birth. This is a birth resulting from preterm labor (not medically induced) or preterm premature rupture of membranes (PPROM).  Indicated preterm birth. This is a birth resulting from labor being medically induced due to health, personal, or social reasons. Preterm birth may be related to certain medical conditions, lifestyle factors, or demographic factors encountered by the mother or fetus.   Medical conditions include:  Multiple gestations (twins, triplets, and so on).  Infection.  Diabetes.  Heart disease.  Kidney disease.  Cervical or uterine  abnormalities.  Being underweight.  High blood pressure or preeclampsia.  Premature rupture of membranes (PROM).  Birth defects in the fetus.  Lifestyle factors include:  Poor prenatal care.  Poor nutrition or anemia.  Cigarette smoking.  Consuming alcohol.  High levels of stress and lack of social or emotional support.  Exposure to chemical or environmental toxins.  Substance abuse.  Demographic factors include:  African-American ethnicity.  Age (younger than 21 or older than 3).  Low socioeconomic status. Women with a history of preterm labor or who become pregnant within 43 months of giving birth are also at increased risk for preterm birth. DIAGNOSIS  Your caregiver may request additional tests to diagnose underlying complications resulting from preterm birth. Tests on the infant may include:  Physical exam.  Blood tests.  Chest X-rays.  Heart-lung monitoring. PREVENTION There are some things you can do to help lower your risk of having a preterm infant in the future. These include:  Good prenatal care throughout the entire pregnancy. See a caregiver regularly for advice and tests.  Management of underlying medical conditions.  Proper self-care and lifestyle changes.  Proper diet and weight control.  Watching for signs of various infections. TREATMENT  After birth, special care will be taken to assess any problems or complications of the infant. Supportive care will be provided for the infant. Treatment depends on what problems are present and any complications that develop. Some preterm infants are cared for in a neonatal intensive care unit. In general, care may include:  Maintaining temperature and oxygen in a clear heated box (baby isolette).  Monitoring the infant's heart rate, breathing, and level of oxygen in the blood.  Monitoring for signs of  infection and, if needed, giving intravenous (IV) antibiotic medicine.  Inserting a feeding tube  (nose, mouth) or giving IV nutrition if unable to feed.  Inserting a breathing tube (ventilation).  Respiration support (continuous positive airway pressure [CPAP] or oxygen). Treatment will change as the infant builds up strength and is able to breathe and eat on his or her own. For some infants, no special treatment is necessary. Parents may be educated on the potential health risks of prematurity to the infant. HOME CARE INSTRUCTIONS  Understand your infant's special conditions and needs. It may be reassuring to learn about infant CPR.  Monitor your infant in the car seat until he or she grows and matures. Infant car seats can cause breathing difficulties for preterm infants.  Keep your infant warm. Dress your infant in layers and keep him or her away from drafts, especially in cold months of the year.  Wash your hands thoroughly after going to the bathroom or changing a diaper. Late preterm infants may be more prone to infection.  Follow all your caregiver's instructions for providing support and care to your preterm infant.  Get support from organizations and groups that understand your challenges.  Follow up with your infant's caregiver as directed. SEEK MEDICAL CARE IF:  Your infant has feeding difficulties.  Your infant has sleeping difficulties.  Your infant has breathing difficulties.  Your infant's skin starts to look yellow (jaundice).  Your infant shows signs of infection like a stuffy nose, fever, crying, or bluish color of the skin. FOR MORE INFORMATION [employer]: www.marchofdimes.com Prematurity.org: www.prematurity.org Document Released: 05/05/2003 Document Revised: 05/07/2011 Document Reviewed: 07/07/2009 [facility] Patient Information 2013 [place], [place].

## 2012-05-04 ENCOUNTER — Inpatient Hospital Stay (HOSPITAL_COMMUNITY)
Admission: AD | Admit: 2012-05-04 | Discharge: 2012-05-04 | Disposition: A | Discharge status: Home / Self Care | Payer: Medicaid Other | Source: Ambulatory Visit | Attending: Obstetrics and Gynecology | Admitting: Obstetrics and Gynecology

## 2012-05-04 ENCOUNTER — Encounter (HOSPITAL_COMMUNITY): Payer: Self-pay | Admitting: *Deleted

## 2012-05-04 DIAGNOSIS — O4700 False labor before 37 completed weeks of gestation, unspecified trimester: Secondary | ICD-10-CM | POA: Insufficient documentation

## 2012-05-04 DIAGNOSIS — M545 Low back pain, unspecified: Secondary | ICD-10-CM | POA: Insufficient documentation

## 2012-05-04 LAB — FETAL FIBRONECTIN: Fetal Fibronectin: NEGATIVE

## 2012-05-04 LAB — URINALYSIS, ROUTINE W REFLEX MICROSCOPIC
Glucose, UA: NEGATIVE mg/dL
Ketones, ur: NEGATIVE mg/dL
Leukocytes, UA: NEGATIVE
Nitrite: NEGATIVE
Protein, ur: NEGATIVE mg/dL
pH: 7.5 (ref 5.0–8.0)

## 2012-05-04 LAB — URINE MICROSCOPIC-ADD ON

## 2012-05-04 MED ORDER — CYCLOBENZAPRINE HCL 5 MG PO TABS: 5.0000 mg | ORAL_TABLET | Freq: Three times a day (TID) | ORAL | Status: DC | PRN

## 2012-05-04 MED ORDER — CYCLOBENZAPRINE HCL 10 MG PO TABS
10.0000 mg | ORAL_TABLET | Freq: Once | ORAL | Status: AC
  Administered 2012-05-04: 10 mg via ORAL
  Filled 2012-05-04: qty 1

## 2012-05-04 NOTE — MAU Note (Signed)
Pt c/o of contractions and pressure for the last 2 weeks

## 2012-05-06 NOTE — MAU Provider Note (Signed)
Attestation of Attending Supervision of Advanced Practitioner (CNM/NP): Evaluation and management procedures were performed by the Advanced Practitioner under my supervision and collaboration.  I have reviewed the Advanced Practitioner's note and chart, and I agree with the management and plan.  Kyasia Steuck 05/06/2012 11:04 AM

## 2012-05-06 NOTE — MAU Provider Note (Signed)
Chief Complaint:  Contractions   First Provider Initiated Contact with Patient 05/04/12 [phone]    HPI: Paige Bates is a 27 y.o. [id number] at [redacted]w[redacted]d who presents to maternity admissions reporting contractions and pressure for the last 2 weeks, worse over night, ~2 UC's per hour. Also reports constant pain in low back. Good fetal movement.   Past Medical History: Past Medical History  Diagnosis Date  . Depression     no meds  . Anemia   . Preterm labor     PTL with 2nd child    Past obstetric history: OB History   Grav Para Term Preterm Abortions TAB SAB Ect Mult Living   4 2 2  1 1    2      # Outc Date GA Lbr Len/2nd Wgt Sex Del Anes PTL Lv   1 TAB 2004           2 TRM 7/09 [redacted]w[redacted]d   F SVD EPI No Yes   3 TRM 8/10 [redacted]w[redacted]d   F SVD None Yes Yes   Comments: PTL at 7mos, admitted on magnesium. delivered at term.    4 CUR               Past Surgical History: Past Surgical History  Procedure Laterality Date  . No past surgeries      Family History: Family History  Problem Relation Age of Onset  . Other Neg Hx     Social History: History  Substance Use Topics  . Smoking status: Never Smoker   . Smokeless tobacco: Never Used  . Alcohol Use: No    Allergies:  Allergies  Allergen Reactions  . Latex Itching and Rash    Meds:  No prescriptions prior to admission    ROS: Neg for LOF, VB, urinary complaints, GI complaints.  Physical Exam  Blood pressure 105/75, pulse 96, resp. rate 20, height 5\' 5"  (1.651 m), weight 102.059 kg (225 lb), last menstrual period 09/24/2011, SpO2 100.00%. GENERAL: Well-developed, well-nourished female in moderate distress upon arrival.  HEENT: normocephalic HEART: normal rate RESP: normal effort ABDOMEN: Soft, non-tender, gravid appropriate for gestational age BACK: No CVAT EXTREMITIES: Nontender, no edema NEURO: alert and oriented SPECULUM EXAM: NEFG, physiologic discharge, no blood, cervix clean Dilation: Closed Effacement (%):  50 Cervical Position: Middle Station: Ballotable Presentation: Undeterminable Exam by:: Briant Cedar RN  FHT:  Baseline 145 , moderate variability, accelerations present, no decelerations Contractions: rare, mild to palpation   Labs: Results for orders placed during the hospital encounter of 05/04/12 (from the past 168 hour(s))  FETAL FIBRONECTIN   Collection Time    05/04/12  6:10 AM      Result Value Range   Fetal Fibronectin NEGATIVE  NEGATIVE  URINALYSIS, ROUTINE W REFLEX MICROSCOPIC   Collection Time    05/04/12  7:15 AM      Result Value Range   Color, Urine YELLOW  YELLOW   APPearance CLEAR  CLEAR   Specific Gravity, Urine 1.020  1.005 - 1.030   pH 7.5  5.0 - 8.0   Glucose, UA NEGATIVE  NEGATIVE mg/dL   Hgb urine dipstick TRACE (*) NEGATIVE   Bilirubin Urine NEGATIVE  NEGATIVE   Ketones, ur NEGATIVE  NEGATIVE mg/dL   Protein, ur NEGATIVE  NEGATIVE mg/dL   Urobilinogen, UA 0.2  0.0 - 1.0 mg/dL   Nitrite NEGATIVE  NEGATIVE   Leukocytes, UA NEGATIVE  NEGATIVE  URINE MICROSCOPIC-ADD ON   Collection Time    05/04/12  7:15 AM      Result Value Range   Squamous Epithelial / LPF FEW (*) RARE   WBC, UA 0-2  <3 WBC/hpf   RBC / HPF 0-2  <3 RBC/hpf   Bacteria, UA FEW (*) RARE   Imaging:  No results found. MAU Course: Calm after Flexeril, PO fluids and reassurance that cervix is not dilated.   Assessment: 1. Round ligament pain    Plan: Discharge home Preter labor precautions and fetal kick counts Follow-up Information   Follow up with Cliffton Asters, MD.   Contact information:   [address] [place] [address] [phone]        Medication List    TAKE these medications       acetaminophen 325 MG tablet  Commonly known as:  TYLENOL  Take 650 mg by mouth every 6 (six) hours as needed. For headache pain     cyclobenzaprine 5 MG tablet  Commonly known as:  FLEXERIL  Take 1 tablet (5 mg total) by mouth 3 (three) times daily as needed for  muscle spasms.     glycopyrrolate 1 MG tablet  Commonly known as:  ROBINUL  Take 1 tablet (1 mg total) by mouth 3 (three) times daily.     lansoprazole 15 MG capsule  Commonly known as:  PREVACID  Take 2 capsules (30 mg total) by mouth daily.     ondansetron 8 MG tablet  Commonly known as:  ZOFRAN  Take 1 tablet (8 mg total) by mouth every 8 (eight) hours as needed for nausea.     prenatal vitamin w/FE, FA 27-1 MG Tabs  Take 1 tablet by mouth daily.     promethazine 12.5 MG tablet  Commonly known as:  PHENERGAN  Take 1 tablet (12.5 mg total) by mouth every 6 (six) hours as needed for nausea.     promethazine 25 MG tablet  Commonly known as:  PHENERGAN  Take 1 tablet (25 mg total) by mouth every 6 (six) hours as needed for nausea.     promethazine 25 MG tablet  Commonly known as:  PHENERGAN  Take 25 mg by mouth every 6 (six) hours as needed.       [place], [place] 05/06/2012 10:33 AM

## 2012-05-08 ENCOUNTER — Ambulatory Visit (INDEPENDENT_AMBULATORY_CARE_PROVIDER_SITE_OTHER): Payer: Medicaid Other | Admitting: Obstetrics & Gynecology

## 2012-05-08 DIAGNOSIS — B373 Candidiasis of vulva and vagina: Secondary | ICD-10-CM

## 2012-05-08 LAB — POCT URINALYSIS DIP (DEVICE)
Protein, ur: 30 mg/dL — AB
Specific Gravity, Urine: 1.02 (ref 1.005–1.030)
Urobilinogen, UA: 0.2 mg/dL (ref 0.0–1.0)

## 2012-05-08 MED ORDER — FLUCONAZOLE 150 MG PO TABS: 150.0000 mg | ORAL_TABLET | Freq: Once | ORAL | Status: DC

## 2012-05-08 MED ORDER — LANSOPRAZOLE 30 MG PO CPDR: 30.0000 mg | DELAYED_RELEASE_CAPSULE | Freq: Every day | ORAL | Status: DC

## 2012-05-08 NOTE — Patient Instructions (Signed)

## 2012-05-08 NOTE — Progress Notes (Signed)
Lots of contractions, PTL was r/o 3/10 in MAU, neg FFN. Vaginal d/c c/w yeast, will Rx Diflucan.

## 2012-05-08 NOTE — Progress Notes (Signed)
Patient c/o contractions.  They are irregular.

## 2012-05-15 ENCOUNTER — Encounter: Payer: Medicaid Other | Admitting: Obstetrics and Gynecology

## 2012-05-22 ENCOUNTER — Ambulatory Visit (INDEPENDENT_AMBULATORY_CARE_PROVIDER_SITE_OTHER): Payer: Medicaid Other | Admitting: Obstetrics & Gynecology

## 2012-05-22 ENCOUNTER — Other Ambulatory Visit: Payer: Self-pay | Admitting: Advanced Practice Midwife

## 2012-05-22 VITALS — BP 116/71 | Temp 96.5°F | Wt 229.9 lb

## 2012-05-22 DIAGNOSIS — IMO0002 Reserved for concepts with insufficient information to code with codable children: Secondary | ICD-10-CM

## 2012-05-22 LAB — POCT URINALYSIS DIP (DEVICE)
Nitrite: NEGATIVE
Urobilinogen, UA: 0.2 mg/dL (ref 0.0–1.0)
pH: 7.5 (ref 5.0–8.0)

## 2012-05-22 NOTE — Progress Notes (Signed)
Pressure and pain, no leaking. PTL precautions

## 2012-05-22 NOTE — Progress Notes (Signed)
Pulse- 102 Fell in the kitchen last week- did not go to ER is now having pelvic pain symptoms.  Feels like pelvic is swelling

## 2012-05-22 NOTE — Patient Instructions (Signed)

## 2012-05-29 ENCOUNTER — Ambulatory Visit (INDEPENDENT_AMBULATORY_CARE_PROVIDER_SITE_OTHER): Payer: Medicaid Other | Admitting: Advanced Practice Midwife

## 2012-05-29 VITALS — BP 128/75 | Temp 97.6°F | Wt 231.2 lb

## 2012-05-29 DIAGNOSIS — O98519 Other viral diseases complicating pregnancy, unspecified trimester: Secondary | ICD-10-CM

## 2012-05-29 LAB — POCT URINALYSIS DIP (DEVICE)
Leukocytes, UA: NEGATIVE
Nitrite: NEGATIVE
Protein, ur: 30 mg/dL — AB
pH: 7 (ref 5.0–8.0)

## 2012-05-29 MED ORDER — NIFEDIPINE 10 MG PO CAPS: 10.0000 mg | ORAL_CAPSULE | Freq: Four times a day (QID) | ORAL | Status: DC | PRN

## 2012-05-29 NOTE — Progress Notes (Signed)
Pulse- 107  Pain/pressure- lower abd, back

## 2012-05-29 NOTE — Progress Notes (Signed)
Good fetal movement, denies vaginal bleeding, LOF.  She is having uncomfortable contractions that have been keeping her awake and constant pelvic pressure and back pain.  Cervix unchanged from last week.  Reviewed s/sx of labor and reasons to return to hospital.  Pt tearful, unable to sleep because of contractions.  Does not want sedating medications prescribed.  Procardia 10 mg Q 6 hours PRN.  Pt may take Flexeril she has at home for relief from back pain.  Return to clinic in 1 week.

## 2012-06-01 ENCOUNTER — Encounter (HOSPITAL_COMMUNITY): Payer: Self-pay | Admitting: *Deleted

## 2012-06-01 ENCOUNTER — Inpatient Hospital Stay (HOSPITAL_COMMUNITY)
Admission: AD | Admit: 2012-06-01 | Discharge: 2012-06-02 | Disposition: A | Discharge status: Hospice | Payer: Medicaid Other | Source: Ambulatory Visit | Attending: Obstetrics & Gynecology | Admitting: Obstetrics & Gynecology

## 2012-06-01 DIAGNOSIS — O4700 False labor before 37 completed weeks of gestation, unspecified trimester: Secondary | ICD-10-CM | POA: Insufficient documentation

## 2012-06-01 HISTORY — DX: Chronic viral hepatitis B without delta-agent: B18.1

## 2012-06-01 MED ORDER — NALBUPHINE SYRINGE 5 MG/0.5 ML
10.0000 mg | INJECTION | INTRAMUSCULAR | Status: DC | PRN
  Administered 2012-06-01: 10 mg via INTRAMUSCULAR
  Filled 2012-06-01: qty 1

## 2012-06-01 NOTE — MAU Note (Signed)
Pt G4 P2 at 35.6wks having contractions and passing mucous plug.

## 2012-06-01 NOTE — ED Notes (Signed)
History     CSN: [id number]  Arrival date and time: 06/01/12 1911   First Provider Initiated Contact with Patient 06/01/12 1953      Chief Complaint  Patient presents with  . Labor Eval   HPI Paige Bates is a 27 y.o. [id number] at [redacted]w[redacted]d who presents complaining of painful contractions. Has had contractions for one week. Denies bleeding or leaking of fluid, just has mucous.  Pregnancy thus far has been uncomplicated except for some morning sickness.   OB History   Grav Para Term Preterm Abortions TAB SAB Ect Mult Living   4 2 2  1 1    2       Past Medical History  Diagnosis Date  . Depression     no meds  . Anemia   . Preterm labor     PTL with 2nd child  . Chronic hepatitis B     Past Surgical History  Procedure Laterality Date  . No past surgeries      Family History  Problem Relation Age of Onset  . Other Neg Hx     History  Substance Use Topics  . Smoking status: Never Smoker   . Smokeless tobacco: Never Used  . Alcohol Use: No    Allergies:  Allergies  Allergen Reactions  . Latex Itching and Rash    Prescriptions prior to admission  Medication Sig Dispense Refill  . lansoprazole (PREVACID) 30 MG capsule Take 1 capsule (30 mg total) by mouth daily.  30 capsule  3  . NIFEdipine (PROCARDIA) 10 MG capsule Take 1 capsule (10 mg total) by mouth every 6 (six) hours as needed.  30 capsule  1  . prenatal vitamin w/FE, FA (PRENATAL 1 + 1) 27-1 MG TABS Take 1 tablet by mouth daily.        . promethazine (PHENERGAN) 25 MG tablet Take 1 tablet (25 mg total) by mouth every 6 (six) hours as needed for nausea.  30 tablet  0  . promethazine (PHENERGAN) 25 MG tablet Take 25 mg by mouth every 6 (six) hours as needed.        ROS Physical Exam   Blood pressure 128/67, pulse 116, temperature 98.2 F (36.8 C), temperature source Oral, resp. rate 20, height 5\' 5"  (1.651 m), weight 235 lb 3.2 oz (106.686 kg), last menstrual period 09/24/2011.  Physical Exam Gen:  NAD, appears slightly uncomfortable from contractions Resp: normal respiratory effort Neuro: grossly nonfocal, speech intact GU: Dilation: 3.5 Effacement (%): 60 Cervical Position: Middle Station: -2 Presentation: Vertex Exam by:: Elie Confer RN  MAU Course  Procedures  FHR: baseline 150s, moderate variability, accels present, decels none Toco: + one large ctx, some uterine irritability  MDM Cervix last noted to be 1.5cm on 4/3. Since cervix has changed since that time, will monitor in MAU for another 1-2 hours and reassess cervix to see if pt is in active labor. Will also check UA.  Assessment and Plan  Possibly in labor Monitor in MAU and reassess cervix in 1-2 hours  Levert Feinstein 06/01/2012, 8:00 PM   New Note by Wynelle Bourgeois CNM  RN states cervix has changed per her exam to 4+cm My exam is different however.  Dilation: 3 Effacement (%): 70 Cervical Position: Middle Station: -3 Presentation: Vertex Exam by:: Artelia Laroche CNM  I feel her cervix is posterior, soft but only 3cm, though it could be stretched to 4  UCs do not appear to be very close though they do  not trace well.  I will give her a shot of Nubain and reassess.

## 2012-06-01 NOTE — MAU Provider Note (Signed)
History     CSN: [id number]  Arrival date and time: 06/01/12 1911   First Provider Initiated Contact with Patient 06/01/12 2103      Chief Complaint  Patient presents with  . Labor Eval   HPI This is a 27 y.o. female at [redacted]w[redacted]d who presents for evaluation of labor. Has been here since 1911.  Denies leaking. Has been on Procardia at home, but states is not working.   RN Note: Pt G4 P2 at 35.6wks having contractions and passing mucous plug.    OB History   Grav Para Term Preterm Abortions TAB SAB Ect Mult Living   4 2 2  1 1    2       Past Medical History  Diagnosis Date  . Depression     no meds  . Anemia   . Preterm labor     PTL with 2nd child  . Chronic hepatitis B     Past Surgical History  Procedure Laterality Date  . No past surgeries      Family History  Problem Relation Age of Onset  . Other Neg Hx     History  Substance Use Topics  . Smoking status: Never Smoker   . Smokeless tobacco: Never Used  . Alcohol Use: No    Allergies:  Allergies  Allergen Reactions  . Latex Itching and Rash    Prescriptions prior to admission  Medication Sig Dispense Refill  . lansoprazole (PREVACID) 30 MG capsule Take 1 capsule (30 mg total) by mouth daily.  30 capsule  3  . NIFEdipine (PROCARDIA) 10 MG capsule Take 1 capsule (10 mg total) by mouth every 6 (six) hours as needed.  30 capsule  1  . Prenatal Vit-Fe Fumarate-FA (PRENATAL MULTIVITAMIN) TABS Take 1 tablet by mouth daily at 12 noon.        Review of Systems  Constitutional: Positive for malaise/fatigue. Negative for fever and chills.  Gastrointestinal: Positive for abdominal pain. Negative for nausea, vomiting, diarrhea and constipation.  Genitourinary: Negative for dysuria.  Neurological: Positive for weakness. Negative for dizziness and headaches.   Physical Exam   Blood pressure 128/67, pulse 116, temperature 98.2 F (36.8 C), temperature source Oral, resp. rate 20, height 5\' 5"  (1.651 m), weight  235 lb 3.2 oz (106.686 kg), last menstrual period 09/24/2011.  Physical Exam  Constitutional: She is oriented to person, place, and time. She appears well-developed and well-nourished. No distress.  Cardiovascular: Normal rate.   Respiratory: Effort normal.  GI: Soft. She exhibits no distension and no mass. There is no tenderness. There is no rebound and no guarding.  Genitourinary: Uterus normal. Vaginal discharge (white mucous) found.  Initial exams were done by RN.  She felt that cervix made change over first 2 hrs, but my exam was only 3cm.  I then observed her for one more hour and her cervix did not change, and the vertex infact was even higher.   FHR reactive.  UCs now are every 5-9 minutes  Musculoskeletal: Normal range of motion.  Neurological: She is alert and oriented to person, place, and time.  Skin: Skin is warm and dry.  Psychiatric: She has a normal mood and affect.    MAU Course  Procedures  MDM She has had a 4 hour evaluation for labor with no change in cervix UCs are not regular.  Vertex is high and cervix very posterior. Discussed that while I cannot predict the future, nor can guarantee that she will not be  in labor over the next few hours, staying here will not change that. We cannot augement her labor since she is preterm. She does not appear to be actively laboring, though she is uncomfortable. I gave her Nubain which she said helps some but does not want any pain meds for home.   Assessment and Plan  A:  SIUP at [redacted]w[redacted]d       Prodromal contractions  P:  Discharge home      Labor precautions     [facility] 06/01/2012, 9:15 PM

## 2012-06-02 NOTE — MAU Provider Note (Signed)

## 2012-06-03 ENCOUNTER — Encounter (HOSPITAL_COMMUNITY): Payer: Self-pay | Admitting: *Deleted

## 2012-06-03 ENCOUNTER — Inpatient Hospital Stay (HOSPITAL_COMMUNITY)
Admission: AD | Admit: 2012-06-03 | Discharge: 2012-06-03 | Disposition: A | Discharge status: Home / Self Care | Payer: Medicaid Other | Source: Ambulatory Visit | Attending: Obstetrics and Gynecology | Admitting: Obstetrics and Gynecology

## 2012-06-03 DIAGNOSIS — O4703 False labor before 37 completed weeks of gestation, third trimester: Secondary | ICD-10-CM

## 2012-06-03 DIAGNOSIS — O4700 False labor before 37 completed weeks of gestation, unspecified trimester: Secondary | ICD-10-CM | POA: Insufficient documentation

## 2012-06-03 LAB — URINALYSIS, ROUTINE W REFLEX MICROSCOPIC
Bilirubin Urine: NEGATIVE
Ketones, ur: NEGATIVE mg/dL
Nitrite: NEGATIVE
Urobilinogen, UA: 0.2 mg/dL (ref 0.0–1.0)

## 2012-06-03 LAB — URINE MICROSCOPIC-ADD ON

## 2012-06-03 MED ORDER — CYCLOBENZAPRINE HCL 10 MG PO TABS
10.0000 mg | ORAL_TABLET | Freq: Once | ORAL | Status: AC
  Administered 2012-06-03: 10 mg via ORAL
  Filled 2012-06-03: qty 1

## 2012-06-03 MED ORDER — OXYCODONE-ACETAMINOPHEN 5-325 MG PO TABS
1.0000 | ORAL_TABLET | Freq: Once | ORAL | Status: AC
  Administered 2012-06-03: 1 via ORAL
  Filled 2012-06-03: qty 1

## 2012-06-03 NOTE — MAU Note (Signed)
Here Sunday for labor evaluation and sent home, pt has lots of vaginal pressure , hurts to walk, came to room via wheelchair, pulse rate 125

## 2012-06-03 NOTE — Progress Notes (Signed)
MD in delivery, requests that RN check pt., will come to see pt after delivery.

## 2012-06-03 NOTE — MAU Provider Note (Signed)
Asked to see pt since she is so uncomfortable with her ctx. Pt is G4P2012 at [redacted]w[redacted]d. Cervix unchanged.  REactive FHT.  Pt states she has had ctx for 2 weeks.  Pt states that she had Nubain on Sunday and she did not like the way it made her feel.  She was seen in clinic and given Procardia, but pt states that did not relieve her ctx.  Pt has been given Flexeril in the past for her ligament pain, which helped her back.  Options discussed with pt for management of pain.  Will try Flexeril again today. Pt denies bleeding or leakage of fluid. Urinalysis collected. Results for orders placed during the hospital encounter of 06/03/12 (from the past 24 hour(s))  URINALYSIS, ROUTINE W REFLEX MICROSCOPIC     Status: Abnormal   Collection Time    06/03/12  9:18 AM      Result Value Range   Color, Urine YELLOW  YELLOW   APPearance CLEAR  CLEAR   Specific Gravity, Urine 1.015  1.005 - 1.030   pH 6.5  5.0 - 8.0   Glucose, UA NEGATIVE  NEGATIVE mg/dL   Hgb urine dipstick TRACE (*) NEGATIVE   Bilirubin Urine NEGATIVE  NEGATIVE   Ketones, ur NEGATIVE  NEGATIVE mg/dL   Protein, ur NEGATIVE  NEGATIVE mg/dL   Urobilinogen, UA 0.2  0.0 - 1.0 mg/dL   Nitrite NEGATIVE  NEGATIVE   Leukocytes, UA NEGATIVE  NEGATIVE  URINE MICROSCOPIC-ADD ON     Status: None   Collection Time    06/03/12  9:18 AM      Result Value Range   Squamous Epithelial / LPF RARE  RARE   RBC / HPF 0-2  <3 RBC/hpf   Bacteria, UA RARE  RARE  care handed over to [place], CNM  EFM: 130's, category I Toco: Rare, mild-moderate  Dilation: 3.5 Effacement (%): Thick Cervical Position: Posterior Station: -3 Presentation: Vertex Exam by:: Ivonne Andrew CNM  Assessment: 1. False labor before 37 completed weeks of gestation in third trimester   FHR category I  Plan: D/C home. Labor precautions and FKCs. Lengthy discussion about braxton Hicks contractions, importance of avoiding augmentation labor prior to 39 weeks due to increased risk  of complications in near-term infants. Discussed comfort measures. Continue Flexeril 10 mg Q8 PRN and increase fluids and rest. Pt verbalized understanding. Follow-up Information   Follow up with [facility] On 06/05/2012. (as scheduled)    Contact information:   [address] [place] [address] [phone]      Follow up with [facility]. (As needed if symptoms worsen)    Contact information:   [address] [id number] [place] [address] [phone]       Medication List    TAKE these medications       acetaminophen 500 MG tablet  Commonly known as:  TYLENOL  Take 500 mg by mouth every 6 (six) hours as needed for pain (headache).     lansoprazole 30 MG capsule  Commonly known as:  PREVACID  Take 1 capsule (30 mg total) by mouth daily.     prenatal multivitamin Tabs  Take 1 tablet by mouth daily at 12 noon.      Has Flexeril at home.   [place], [place] 06/03/2012 12:17 PM

## 2012-06-05 ENCOUNTER — Other Ambulatory Visit: Payer: Self-pay | Admitting: Obstetrics & Gynecology

## 2012-06-05 ENCOUNTER — Ambulatory Visit (INDEPENDENT_AMBULATORY_CARE_PROVIDER_SITE_OTHER): Payer: Medicaid Other | Admitting: Obstetrics & Gynecology

## 2012-06-05 DIAGNOSIS — O4700 False labor before 37 completed weeks of gestation, unspecified trimester: Secondary | ICD-10-CM

## 2012-06-05 LAB — POCT URINALYSIS DIP (DEVICE)
Ketones, ur: NEGATIVE mg/dL
Protein, ur: 30 mg/dL — AB
Specific Gravity, Urine: 1.02 (ref 1.005–1.030)
pH: 7 (ref 5.0–8.0)

## 2012-06-05 LAB — GLUCOSE, CAPILLARY: Glucose-Capillary: 82 mg/dL (ref 70–99)

## 2012-06-05 NOTE — Patient Instructions (Signed)
Return to clinic for any obstetric concerns or go to MAU for evaluation  

## 2012-06-05 NOTE — Progress Notes (Signed)
No GTT done, random glucose 82.  No cervical change, still having preterm contractions.  Pelvic cultures done. No other complaints or concerns.  Fetal movement and labor precautions reviewed.

## 2012-06-06 LAB — GC/CHLAMYDIA PROBE AMP: GC Probe RNA: NEGATIVE

## 2012-06-06 NOTE — MAU Provider Note (Signed)
Attestation of Attending Supervision of Advanced Practitioner (CNM/NP): Evaluation and management procedures were performed by the Advanced Practitioner under my supervision and collaboration.  I have reviewed the Advanced Practitioner's note and chart, and I agree with the management and plan.  HARRAWAY-SMITH, Hanan Moen 1:35 PM     

## 2012-06-08 LAB — CULTURE, BETA STREP (GROUP B ONLY)

## 2012-06-10 ENCOUNTER — Encounter: Payer: Self-pay | Admitting: Obstetrics & Gynecology

## 2012-06-11 ENCOUNTER — Inpatient Hospital Stay (HOSPITAL_COMMUNITY)
Admission: AD | Admit: 2012-06-11 | Discharge: 2012-06-11 | Disposition: A | Discharge status: Home / Self Care | Payer: Medicaid Other | Source: Ambulatory Visit | Attending: Obstetrics & Gynecology | Admitting: Obstetrics & Gynecology

## 2012-06-11 ENCOUNTER — Encounter (HOSPITAL_COMMUNITY): Payer: Self-pay | Admitting: *Deleted

## 2012-06-11 DIAGNOSIS — O479 False labor, unspecified: Secondary | ICD-10-CM | POA: Insufficient documentation

## 2012-06-11 DIAGNOSIS — O98519 Other viral diseases complicating pregnancy, unspecified trimester: Secondary | ICD-10-CM | POA: Insufficient documentation

## 2012-06-11 DIAGNOSIS — IMO0002 Reserved for concepts with insufficient information to code with codable children: Secondary | ICD-10-CM | POA: Insufficient documentation

## 2012-06-11 MED ORDER — MORPHINE SULFATE 10 MG/ML IJ SOLN: 10.0000 mg | Freq: Once | INTRAMUSCULAR | Status: DC

## 2012-06-11 MED ORDER — OXYCODONE-ACETAMINOPHEN 5-325 MG PO TABS
2.0000 | ORAL_TABLET | Freq: Once | ORAL | Status: AC
  Administered 2012-06-11: 2 via ORAL
  Filled 2012-06-11: qty 2

## 2012-06-11 MED ORDER — PROMETHAZINE HCL 25 MG/ML IJ SOLN: 25.0000 mg | Freq: Four times a day (QID) | INTRAMUSCULAR | Status: DC | PRN

## 2012-06-11 NOTE — MAU Note (Signed)
contractions 

## 2012-06-12 ENCOUNTER — Ambulatory Visit (INDEPENDENT_AMBULATORY_CARE_PROVIDER_SITE_OTHER): Payer: Medicaid Other | Admitting: Obstetrics & Gynecology

## 2012-06-12 ENCOUNTER — Encounter: Payer: Self-pay | Admitting: Obstetrics & Gynecology

## 2012-06-12 VITALS — BP 111/68 | Temp 97.4°F | Wt 231.2 lb

## 2012-06-12 DIAGNOSIS — O98519 Other viral diseases complicating pregnancy, unspecified trimester: Secondary | ICD-10-CM

## 2012-06-12 DIAGNOSIS — O0993 Supervision of high risk pregnancy, unspecified, third trimester: Secondary | ICD-10-CM

## 2012-06-12 DIAGNOSIS — O4700 False labor before 37 completed weeks of gestation, unspecified trimester: Secondary | ICD-10-CM

## 2012-06-12 LAB — POCT URINALYSIS DIP (DEVICE)
Ketones, ur: NEGATIVE mg/dL
Nitrite: NEGATIVE
Protein, ur: 30 mg/dL — AB

## 2012-06-12 NOTE — Progress Notes (Signed)
Pressure, labor was r/o few days ago. No UC today. Precautions given

## 2012-06-12 NOTE — Patient Instructions (Signed)
Normal Labor and Delivery  Your caregiver must first be sure you are in labor. Signs of labor include:  · You may pass what is called "the mucus plug" before labor begins. This is a small amount of blood stained mucus.  · Regular uterine contractions.  · The time between contractions get closer together.  · The discomfort and pain gradually gets more intense.  · Pains are mostly located in the back.  · Pains get worse when walking.  · The cervix (the opening of the uterus becomes thinner (begins to efface) and opens up (dilates).  Once you are in labor and admitted into the hospital or care center, your caregiver will do the following:  · A complete physical examination.  · Check your vital signs (blood pressure, pulse, temperature and the fetal heart rate).  · Do a vaginal examination (using a sterile glove and lubricant) to determine:  · The position (presentation) of the baby (head [vertex] or buttock first).  · The level (station) of the baby's head in the birth canal.  · The effacement and dilatation of the cervix.  · You may have your pubic hair shaved and be given an enema depending on your caregiver and the circumstance.  · An electronic monitor is usually placed on your abdomen. The monitor follows the length and intensity of the contractions, as well as the baby's heart rate.  · Usually, your caregiver will insert an IV in your arm with a bottle of sugar water. This is done as a precaution so that medications can be given to you quickly during labor or delivery.  NORMAL LABOR AND DELIVERY IS DIVIDED UP INTO 3 STAGES:  First Stage  This is when regular contractions begin and the cervix begins to efface and dilate. This stage can last from 3 to 15 hours. The end of the first stage is when the cervix is 100% effaced and 10 centimeters dilated. Pain medications may be given by   · Injection (morphine, demerol, etc.)  · Regional anesthesia (spinal, caudal or epidural, anesthetics given in different locations of  the spine). Paracervical pain medication may be given, which is an injection of and anesthetic on each side of the cervix.  A pregnant woman may request to have "Natural Childbirth" which is not to have any medications or anesthesia during her labor and delivery.  Second Stage  This is when the baby comes down through the birth canal (vagina) and is born. This can take 1 to 4 hours. As the baby's head comes down through the birth canal, you may feel like you are going to have a bowel movement. You will get the urge to bear down and push until the baby is delivered. As the baby's head is being delivered, the caregiver will decide if an episiotomy (a cut in the perineum and vagina area) is needed to prevent tearing of the tissue in this area. The episiotomy is sewn up after the delivery of the baby and placenta. Sometimes a mask with nitrous oxide is given for the mother to breath during the delivery of the baby to help if there is too much pain. The end of Stage 2 is when the baby is fully delivered. Then when the umbilical cord stops pulsating it is clamped and cut.  Third Stage  The third stage begins after the baby is completely delivered and ends after the placenta (afterbirth) is delivered. This usually takes 5 to 30 minutes. After the placenta is delivered, a medication   is given either by intravenous or injection to help contract the uterus and prevent bleeding. The third stage is not painful and pain medication is usually not necessary. If an episiotomy was done, it is repaired at this time.  After the delivery, the mother is watched and monitored closely for 1 to 2 hours to make sure there is no postpartum bleeding (hemorrhage). If there is a lot of bleeding, medication is given to contract the uterus and stop the bleeding.  Document Released: 11/22/2007 Document Revised: 05/07/2011 Document Reviewed: 11/22/2007  [employer]® Patient Information ©2013 [employer].

## 2012-06-12 NOTE — Progress Notes (Signed)
Contractions from 1am Weds.  Pt went to MAU and was sent home last night.

## 2012-06-19 ENCOUNTER — Ambulatory Visit (INDEPENDENT_AMBULATORY_CARE_PROVIDER_SITE_OTHER): Payer: Medicaid Other | Admitting: Family Medicine

## 2012-06-19 ENCOUNTER — Other Ambulatory Visit: Payer: Self-pay | Admitting: Family Medicine

## 2012-06-19 VITALS — BP 120/72 | Wt 232.5 lb

## 2012-06-19 DIAGNOSIS — O0993 Supervision of high risk pregnancy, unspecified, third trimester: Secondary | ICD-10-CM

## 2012-06-19 DIAGNOSIS — O98519 Other viral diseases complicating pregnancy, unspecified trimester: Secondary | ICD-10-CM

## 2012-06-19 LAB — POCT URINALYSIS DIP (DEVICE)
Glucose, UA: NEGATIVE mg/dL
Leukocytes, UA: NEGATIVE
Nitrite: NEGATIVE
Specific Gravity, Urine: >=1.03 (ref 1.005–1.030)
Urobilinogen, UA: 1 mg/dL (ref 0.0–1.0)

## 2012-06-19 NOTE — Progress Notes (Signed)
Pulse- 110 Patient reports vaginal pain/pressure and a lot of contractions; states she is tired of having contractions and can't sleep

## 2012-06-19 NOTE — Patient Instructions (Addendum)
Normal Labor and Delivery  Your caregiver must first be sure you are in labor. Signs of labor include:  · You may pass what is called "the mucus plug" before labor begins. This is a small amount of blood stained mucus.  · Regular uterine contractions.  · The time between contractions get closer together.  · The discomfort and pain gradually gets more intense.  · Pains are mostly located in the back.  · Pains get worse when walking.  · The cervix (the opening of the uterus becomes thinner (begins to efface) and opens up (dilates).  Once you are in labor and admitted into the hospital or care center, your caregiver will do the following:  · A complete physical examination.  · Check your vital signs (blood pressure, pulse, temperature and the fetal heart rate).  · Do a vaginal examination (using a sterile glove and lubricant) to determine:  · The position (presentation) of the baby (head [vertex] or buttock first).  · The level (station) of the baby's head in the birth canal.  · The effacement and dilatation of the cervix.  · You may have your pubic hair shaved and be given an enema depending on your caregiver and the circumstance.  · An electronic monitor is usually placed on your abdomen. The monitor follows the length and intensity of the contractions, as well as the baby's heart rate.  · Usually, your caregiver will insert an IV in your arm with a bottle of sugar water. This is done as a precaution so that medications can be given to you quickly during labor or delivery.  NORMAL LABOR AND DELIVERY IS DIVIDED UP INTO 3 STAGES:  First Stage  This is when regular contractions begin and the cervix begins to efface and dilate. This stage can last from 3 to 15 hours. The end of the first stage is when the cervix is 100% effaced and 10 centimeters dilated. Pain medications may be given by   · Injection (morphine, demerol, etc.)  · Regional anesthesia (spinal, caudal or epidural, anesthetics given in different locations of  the spine). Paracervical pain medication may be given, which is an injection of and anesthetic on each side of the cervix.  A pregnant woman may request to have "Natural Childbirth" which is not to have any medications or anesthesia during her labor and delivery.  Second Stage  This is when the baby comes down through the birth canal (vagina) and is born. This can take 1 to 4 hours. As the baby's head comes down through the birth canal, you may feel like you are going to have a bowel movement. You will get the urge to bear down and push until the baby is delivered. As the baby's head is being delivered, the caregiver will decide if an episiotomy (a cut in the perineum and vagina area) is needed to prevent tearing of the tissue in this area. The episiotomy is sewn up after the delivery of the baby and placenta. Sometimes a mask with nitrous oxide is given for the mother to breath during the delivery of the baby to help if there is too much pain. The end of Stage 2 is when the baby is fully delivered. Then when the umbilical cord stops pulsating it is clamped and cut.  Third Stage  The third stage begins after the baby is completely delivered and ends after the placenta (afterbirth) is delivered. This usually takes 5 to 30 minutes. After the placenta is delivered, a medication   is given either by intravenous or injection to help contract the uterus and prevent bleeding. The third stage is not painful and pain medication is usually not necessary. If an episiotomy was done, it is repaired at this time.  After the delivery, the mother is watched and monitored closely for 1 to 2 hours to make sure there is no postpartum bleeding (hemorrhage). If there is a lot of bleeding, medication is given to contract the uterus and stop the bleeding.  Document Released: 11/22/2007 Document Revised: 05/07/2011 Document Reviewed: 11/22/2007  Exit[employer]® Patient Information ©2013 [employer].

## 2012-06-19 NOTE — Progress Notes (Signed)
Informal [place] for presentation - vertex- Dr. Thad Ranger informed

## 2012-06-19 NOTE — Progress Notes (Signed)
Irregular ctx for past 3 weeks. No bleeding or LOF but lost mucous plug. Membranes stripped today. Pt states very uncomfortable - back/pelvic pain. BTL papers resigned and reviewed.

## 2012-06-20 ENCOUNTER — Encounter: Payer: Self-pay | Admitting: *Deleted

## 2012-06-21 ENCOUNTER — Inpatient Hospital Stay (HOSPITAL_COMMUNITY)
Admission: AD | Admit: 2012-06-21 | Discharge: 2012-06-24 | DRG: 775 | Disposition: A | Discharge status: Home / Self Care | Payer: Medicaid Other | Source: Ambulatory Visit | Attending: Obstetrics & Gynecology | Admitting: Obstetrics & Gynecology

## 2012-06-21 ENCOUNTER — Encounter (HOSPITAL_COMMUNITY): Payer: Self-pay

## 2012-06-21 DIAGNOSIS — O26619 Liver and biliary tract disorders in pregnancy, unspecified trimester: Secondary | ICD-10-CM | POA: Diagnosis present

## 2012-06-21 DIAGNOSIS — B1910 Unspecified viral hepatitis B without hepatic coma: Secondary | ICD-10-CM | POA: Diagnosis present

## 2012-06-21 DIAGNOSIS — IMO0001 Reserved for inherently not codable concepts without codable children: Secondary | ICD-10-CM

## 2012-06-21 NOTE — MAU Note (Signed)
Contractions all day. Stronger and more regular since 1900. No bleeding or d/c

## 2012-06-22 ENCOUNTER — Encounter (HOSPITAL_COMMUNITY): Payer: Self-pay | Admitting: *Deleted

## 2012-06-22 ENCOUNTER — Inpatient Hospital Stay (HOSPITAL_COMMUNITY): Payer: Medicaid Other | Admitting: Anesthesiology

## 2012-06-22 ENCOUNTER — Encounter (HOSPITAL_COMMUNITY): Payer: Self-pay | Admitting: Anesthesiology

## 2012-06-22 DIAGNOSIS — B1910 Unspecified viral hepatitis B without hepatic coma: Secondary | ICD-10-CM

## 2012-06-22 DIAGNOSIS — O26619 Liver and biliary tract disorders in pregnancy, unspecified trimester: Secondary | ICD-10-CM

## 2012-06-22 LAB — CBC
HCT: 24.9 % — ABNORMAL LOW (ref 36.0–46.0)
MCH: 21.4 pg — ABNORMAL LOW (ref 26.0–34.0)
MCHC: 31.3 g/dL (ref 30.0–36.0)
MCV: 68.2 fL — ABNORMAL LOW (ref 78.0–100.0)
RDW: 17 % — ABNORMAL HIGH (ref 11.5–15.5)

## 2012-06-22 LAB — COMPREHENSIVE METABOLIC PANEL
Alkaline Phosphatase: 145 U/L — ABNORMAL HIGH (ref 39–117)
BUN: 4 mg/dL — ABNORMAL LOW (ref 6–23)
Chloride: 103 mEq/L (ref 96–112)
GFR calc Af Amer: >90 mL/min (ref >90)
Glucose, Bld: 90 mg/dL (ref 70–99)
Potassium: 3.5 mEq/L (ref 3.5–5.1)
Total Bilirubin: 0.2 mg/dL — ABNORMAL LOW (ref 0.3–1.2)

## 2012-06-22 MED ORDER — ONDANSETRON HCL 4 MG PO TABS: 4.0000 mg | ORAL_TABLET | ORAL | Status: DC | PRN

## 2012-06-22 MED ORDER — PHENYLEPHRINE 40 MCG/ML (10ML) SYRINGE FOR IV PUSH (FOR BLOOD PRESSURE SUPPORT)
80.0000 ug | PREFILLED_SYRINGE | INTRAVENOUS | Status: DC | PRN
  Filled 2012-06-22: qty 2

## 2012-06-22 MED ORDER — IBUPROFEN 600 MG PO TABS
600.0000 mg | ORAL_TABLET | Freq: Four times a day (QID) | ORAL | Status: DC | PRN
  Administered 2012-06-22: 600 mg via ORAL
  Filled 2012-06-22: qty 1

## 2012-06-22 MED ORDER — WITCH HAZEL-GLYCERIN EX PADS: 1.0000 "application " | MEDICATED_PAD | CUTANEOUS | Status: DC | PRN

## 2012-06-22 MED ORDER — ONDANSETRON HCL 4 MG/2ML IJ SOLN: 4.0000 mg | Freq: Four times a day (QID) | INTRAMUSCULAR | Status: DC | PRN

## 2012-06-22 MED ORDER — CITRIC ACID-SODIUM CITRATE 334-500 MG/5ML PO SOLN: 30.0000 mL | ORAL | Status: DC | PRN

## 2012-06-22 MED ORDER — LACTATED RINGERS IV SOLN
INTRAVENOUS | Status: DC
  Administered 2012-06-22 (×2): via INTRAVENOUS

## 2012-06-22 MED ORDER — DIPHENHYDRAMINE HCL 25 MG PO CAPS: 25.0000 mg | ORAL_CAPSULE | Freq: Four times a day (QID) | ORAL | Status: DC | PRN

## 2012-06-22 MED ORDER — OXYCODONE-ACETAMINOPHEN 5-325 MG PO TABS
1.0000 | ORAL_TABLET | ORAL | Status: DC | PRN
  Administered 2012-06-22 – 2012-06-23 (×3): 1 via ORAL
  Administered 2012-06-23: 2 via ORAL
  Administered 2012-06-24: 1 via ORAL
  Filled 2012-06-22 (×3): qty 1
  Filled 2012-06-22: qty 2
  Filled 2012-06-22 (×2): qty 1

## 2012-06-22 MED ORDER — FLEET ENEMA 7-19 GM/118ML RE ENEM: 1.0000 | ENEMA | RECTAL | Status: DC | PRN

## 2012-06-22 MED ORDER — LIDOCAINE HCL (PF) 1 % IJ SOLN
30.0000 mL | INTRAMUSCULAR | Status: DC | PRN
  Filled 2012-06-22 (×2): qty 30

## 2012-06-22 MED ORDER — EPHEDRINE 5 MG/ML INJ
10.0000 mg | INTRAVENOUS | Status: DC | PRN
  Filled 2012-06-22: qty 4
  Filled 2012-06-22: qty 2

## 2012-06-22 MED ORDER — IBUPROFEN 600 MG PO TABS
600.0000 mg | ORAL_TABLET | Freq: Four times a day (QID) | ORAL | Status: DC
  Administered 2012-06-22 – 2012-06-24 (×8): 600 mg via ORAL
  Filled 2012-06-22 (×8): qty 1

## 2012-06-22 MED ORDER — DIPHENHYDRAMINE HCL 50 MG/ML IJ SOLN: 12.5000 mg | INTRAMUSCULAR | Status: DC | PRN

## 2012-06-22 MED ORDER — ZOLPIDEM TARTRATE 5 MG PO TABS: 5.0000 mg | ORAL_TABLET | Freq: Every evening | ORAL | Status: DC | PRN

## 2012-06-22 MED ORDER — SIMETHICONE 80 MG PO CHEW: 80.0000 mg | CHEWABLE_TABLET | ORAL | Status: DC | PRN

## 2012-06-22 MED ORDER — BENZOCAINE-MENTHOL 20-0.5 % EX AERO
1.0000 "application " | INHALATION_SPRAY | CUTANEOUS | Status: DC | PRN
  Administered 2012-06-22: 1 via TOPICAL

## 2012-06-22 MED ORDER — FENTANYL 2.5 MCG/ML BUPIVACAINE 1/10 % EPIDURAL INFUSION (WH - ANES)
14.0000 mL/h | INTRAMUSCULAR | Status: DC | PRN
  Administered 2012-06-22: 14 mL/h via EPIDURAL
  Filled 2012-06-22: qty 125

## 2012-06-22 MED ORDER — TETANUS-DIPHTH-ACELL PERTUSSIS 5-2.5-18.5 LF-MCG/0.5 IM SUSP
0.5000 mL | Freq: Once | INTRAMUSCULAR | Status: DC
  Filled 2012-06-22: qty 0.5

## 2012-06-22 MED ORDER — SENNOSIDES-DOCUSATE SODIUM 8.6-50 MG PO TABS
2.0000 | ORAL_TABLET | Freq: Every day | ORAL | Status: DC
  Administered 2012-06-22 – 2012-06-23 (×2): 2 via ORAL

## 2012-06-22 MED ORDER — TERBUTALINE SULFATE 1 MG/ML IJ SOLN: 0.2500 mg | Freq: Once | INTRAMUSCULAR | Status: DC | PRN

## 2012-06-22 MED ORDER — PHENYLEPHRINE 40 MCG/ML (10ML) SYRINGE FOR IV PUSH (FOR BLOOD PRESSURE SUPPORT)
80.0000 ug | PREFILLED_SYRINGE | INTRAVENOUS | Status: DC | PRN
  Filled 2012-06-22: qty 2
  Filled 2012-06-22: qty 5

## 2012-06-22 MED ORDER — LIDOCAINE HCL (PF) 1 % IJ SOLN
INTRAMUSCULAR | Status: DC | PRN
Start: 2012-06-22 — End: 2012-06-22
  Administered 2012-06-22 (×2): 5 mL

## 2012-06-22 MED ORDER — DIBUCAINE 1 % RE OINT: 1.0000 "application " | TOPICAL_OINTMENT | RECTAL | Status: DC | PRN

## 2012-06-22 MED ORDER — OXYTOCIN BOLUS FROM INFUSION: 500.0000 mL | INTRAVENOUS | Status: DC

## 2012-06-22 MED ORDER — ACETAMINOPHEN 325 MG PO TABS: 650.0000 mg | ORAL_TABLET | ORAL | Status: DC | PRN

## 2012-06-22 MED ORDER — OXYTOCIN 40 UNITS IN LACTATED RINGERS INFUSION - SIMPLE MED
1.0000 m[IU]/min | INTRAVENOUS | Status: DC
  Administered 2012-06-22: 8 m[IU]/min via INTRAVENOUS
  Administered 2012-06-22: 2 m[IU]/min via INTRAVENOUS

## 2012-06-22 MED ORDER — OXYCODONE-ACETAMINOPHEN 5-325 MG PO TABS: 1.0000 | ORAL_TABLET | ORAL | Status: DC | PRN

## 2012-06-22 MED ORDER — BUPIVACAINE HCL (PF) 0.25 % IJ SOLN
INTRAMUSCULAR | Status: AC
  Filled 2012-06-22: qty 30

## 2012-06-22 MED ORDER — LACTATED RINGERS IV SOLN: 500.0000 mL | INTRAVENOUS | Status: DC | PRN

## 2012-06-22 MED ORDER — FENTANYL CITRATE 0.05 MG/ML IJ SOLN
100.0000 ug | INTRAMUSCULAR | Status: DC | PRN
  Administered 2012-06-22: 100 ug via INTRAVENOUS
  Filled 2012-06-22 (×2): qty 2

## 2012-06-22 MED ORDER — ONDANSETRON HCL 4 MG/2ML IJ SOLN: 4.0000 mg | INTRAMUSCULAR | Status: DC | PRN

## 2012-06-22 MED ORDER — LACTATED RINGERS IV SOLN: 500.0000 mL | Freq: Once | INTRAVENOUS | Status: DC

## 2012-06-22 MED ORDER — PRENATAL MULTIVITAMIN CH
1.0000 | ORAL_TABLET | Freq: Every day | ORAL | Status: DC
  Administered 2012-06-22 – 2012-06-24 (×3): 1 via ORAL
  Filled 2012-06-22 (×3): qty 1

## 2012-06-22 MED ORDER — OXYTOCIN 40 UNITS IN LACTATED RINGERS INFUSION - SIMPLE MED
62.5000 mL/h | INTRAVENOUS | Status: DC
  Filled 2012-06-22: qty 1000

## 2012-06-22 MED ORDER — EPHEDRINE 5 MG/ML INJ
10.0000 mg | INTRAVENOUS | Status: DC | PRN
  Filled 2012-06-22: qty 2

## 2012-06-22 MED ORDER — LANOLIN HYDROUS EX OINT: TOPICAL_OINTMENT | CUTANEOUS | Status: DC | PRN

## 2012-06-22 NOTE — Anesthesia Preprocedure Evaluation (Signed)
Anesthesia Evaluation  Patient identified by MRN, date of birth, ID band Patient awake    Reviewed: Allergy & Precautions, H&P , Patient's Chart, lab work & pertinent test results  Airway Mallampati: III TM Distance: >3 FB Neck ROM: full    Dental no notable dental hx.    Pulmonary neg pulmonary ROS,  breath sounds clear to auscultation  Pulmonary exam normal       Cardiovascular negative cardio ROS  Rhythm:regular Rate:Normal     Neuro/Psych negative neurological ROS  negative psych ROS   GI/Hepatic negative GI ROS, Neg liver ROS, GERD-  ,(+) B  Endo/Other  negative endocrine ROSMorbid obesity  Renal/GU negative Renal ROS     Musculoskeletal   Abdominal   Peds  Hematology negative hematology ROS (+) anemia ,   Anesthesia Other Findings   Reproductive/Obstetrics (+) Pregnancy                           Anesthesia Physical Anesthesia Plan  ASA: III  Anesthesia Plan: Epidural   Post-op Pain Management:    Induction:   Airway Management Planned:   Additional Equipment:   Intra-op Plan:   Post-operative Plan:   Informed Consent: I have reviewed the patients History and Physical, chart, labs and discussed the procedure including the risks, benefits and alternatives for the proposed anesthesia with the patient or authorized representative who has indicated his/her understanding and acceptance.     Plan Discussed with:   Anesthesia Plan Comments:         Anesthesia Quick Evaluation

## 2012-06-22 NOTE — Progress Notes (Signed)
Patient ID: MEGHNA HAGMANN, female   DOB: May 11, 1985, 27 y.o.   MRN: [id number] NEVILLE PAULS is a 27 y.o. [id number] at [redacted]w[redacted]d admitted for SOL  Subjective: Comfortable.   Objective: BP 134/82  Pulse 97  Temp(Src) 98.4 F (36.9 C) (Oral)  Resp 20  Ht 5\' 5"  (1.651 m)  Wt 106.777 kg (235 lb 6.4 oz)  BMI 39.17 kg/m2  SpO2 98%  LMP 09/24/2011  Fetal Heart FHR: 140 bpm, variability: moderate,  accelerations:  Present,  decelerations:  Absent   Contractions:   SVE:   Dilation: 7 Effacement (%): 60 Station: -3;-2 Exam by:: k fields, rn  Assessment / Plan:  Labor: active Fetal Wellbeing: Cat 1 Pain Control:  adequate Expected mode of delivery: NSVD  Kieth Hartis 06/22/2012, 11:44 AM

## 2012-06-22 NOTE — H&P (Signed)
Paige Bates is a 27 y.o. [phone] female at 105w6d by LMP which correlates well w/ 10.6wk u/s, presenting w/ report of uc's all day, worsening since app 1900.  Reports good fm, denies vb or lof.  Initiated pnc in [facility] @ 9.3wks d/t h/o PTL w/o PTB w/ 2nd child.  Genetic screening normal, anatomy scan normal, 1hr gtt not done- random cbg 82 @ 36wks.  H/O 2 38wk svd's in 2009 & 2010.  Pos Hep B on PNI, states she had tested pos previously, but it resolved.  Further testing revealed hepB S Ab neg, Hep B Core total Ab neg, Hep B DNA 5,749, Hep B DNA calc 33,459, Hep Be Ab +, Hep Be Ag neg= chronic carrier.  Had normal LFTs, plan to f/u postpartum.    Maternal Medical History:  Reason for admission: Contractions.   Contractions: Onset was 13-24 hours ago.   Frequency: regular.   Perceived severity is strong.    Fetal activity: Perceived fetal activity is normal.   Last perceived fetal movement was within the past hour.    Prenatal complications: no prenatal complications   OB History   Grav Para Term Preterm Abortions TAB SAB Ect Mult Living   4 2 2  1 1    2      Past Medical History  Diagnosis Date  . Depression     no meds  . Anemia   . Preterm labor     PTL with 2nd child  . Chronic hepatitis B    Past Surgical History  Procedure Laterality Date  . No past surgeries     Family History: family history is negative for Other. Social History:  reports that she has never smoked. She has never used smokeless tobacco. She reports that she does not drink alcohol or use illicit drugs.   Review of Systems  Constitutional: Negative.   HENT: Negative.   Eyes: Negative.   Respiratory: Negative.   Cardiovascular: Negative.   Gastrointestinal: Positive for abdominal pain (uc's).  Genitourinary: Negative.   Musculoskeletal:       Pelvic/pubic bone pain  Skin: Negative.   Neurological: Negative.   Endo/Heme/Allergies: Negative.   Psychiatric/Behavioral: Negative.     Dilation:  4.5 Effacement (%): 70 Station: -2 Exam by:: Joellyn Haff CNM Blood pressure 135/76, pulse 106, temperature 97.9 F (36.6 C), resp. rate 20, height 5\' 5"  (1.651 m), weight 106.777 kg (235 lb 6.4 oz), last menstrual period 09/24/2011, SpO2 100.00%. Maternal Exam:  Uterine Assessment: Contraction strength is moderate.  Contraction frequency is irregular.  q 4-6  Abdomen: Patient reports no abdominal tenderness. Fetal presentation: vertex  Introitus: Normal vulva. Normal vagina.  Pelvis: adequate for delivery.   Cervix: Cervix evaluated by digital exam.     Fetal Exam Fetal Monitor Review: Mode: ultrasound.   Baseline rate: 145.  Variability: moderate (6-25 bpm).   Pattern: accelerations present and no decelerations.    Fetal State Assessment: Category I - tracings are normal.     Physical Exam  Constitutional: She is oriented to person, place, and time. She appears well-developed and well-nourished. She appears distressed (very uncomfortable, teary-eyed).  HENT:  Head: Normocephalic.  Neck: Normal range of motion.  Cardiovascular: Normal rate and regular rhythm.   Respiratory: Effort normal and breath sounds normal.  GI: Soft. Bowel sounds are normal.  gravid  Genitourinary: Vagina normal and uterus normal.  Initial SVE by MAU RN 3/50/-3, 1hr later 3.5/80/-2, 1hr later my exam was 4.5/70/-2, post, soft, vtx  w/ bbow that is well applied to cervix w/ uc  Musculoskeletal: Normal range of motion.  Neurological: She is alert and oriented to person, place, and time. She has normal reflexes.  Skin: Skin is warm and dry.  Psychiatric: She has a normal mood and affect. Her behavior is normal. Judgment and thought content normal.    Prenatal labs: ABO, Rh: B/POS/-- (10/03 1059) Antibody: NEG (10/03 1059) Rubella: 88.5 (10/03 1059) RPR: NON REAC (10/03 1059)  HBsAg: POSITIVE (10/03 1059)  HIV: NON REACTIVE (10/03 1059)  GBS: Negative (04/10 0000)   Assessment/Plan: A:  [redacted]w[redacted]d  SIUP  Early/active labor  GBS neg  Cat I FHR  Chronic Hep B carrier   P:  Admit to BS  Expectant management  IV pain meds/epidural prn  Anticipate NSVD  CMP     Marge Duncans 06/22/2012, 2:10 AM

## 2012-06-22 NOTE — Progress Notes (Signed)
Paige Bates is a 28 y.o. [id number] at [redacted]w[redacted]d admitted for early labor  Subjective: Uncomfortable w/ uc's, has been walking  Objective: BP 116/69  Pulse 95  Temp(Src) 98 F (36.7 C) (Oral)  Resp 20  Ht 5\' 5"  (1.651 m)  Wt 106.777 kg (235 lb 6.4 oz)  BMI 39.17 kg/m2  SpO2 100%  LMP 09/24/2011      FHT:  FHR: 135 bpm, variability: moderate,  accelerations:  Present,  decelerations:  Absent UC:   irregular, every 2-10 minutes SVE:   Dilation: 6 Effacement (%): 80 Station: -2 Exam by:: [employer] CNM  Labs: Lab Results  Component Value Date   WBC 8.5 06/22/2012   HGB 7.8* 06/22/2012   HCT 24.9* 06/22/2012   MCV 68.2* 06/22/2012   PLT 264 06/22/2012    Assessment / Plan: Spontaneous labor, progressing slowly, now arom'd and will begin pitocin per protocol  Labor: slow progression Preeclampsia:  n/a Fetal Wellbeing:  Category I Pain Control:  Labor support without medications I/D:  n/a Anticipated MOD:  NSVD  Discussed status/irregular uc's and poc w/ Dr. Marice Potter who states to arom and start pitocin  Marge Duncans 06/22/2012, 6:43 AM

## 2012-06-22 NOTE — Anesthesia Procedure Notes (Signed)
Epidural Patient location during procedure: OB Start time: 06/22/2012 10:10 AM  Staffing Anesthesiologist: Angus Seller., Harrell Gave. Performed by: anesthesiologist   Preanesthetic Checklist Completed: patient identified, site marked, surgical consent, pre-op evaluation, timeout performed, IV checked, risks and benefits discussed and monitors and equipment checked  Epidural Patient position: sitting Prep: site prepped and draped and DuraPrep Patient monitoring: continuous pulse ox and blood pressure Approach: midline Injection technique: LOR air and LOR saline  Needle:  Needle type: Tuohy  Needle gauge: 17 G Needle length: 9 cm and 9 Needle insertion depth: 6 cm Catheter type: closed end flexible Catheter size: 19 Gauge Catheter at skin depth: 10 cm Test dose: negative  Assessment Events: blood not aspirated, injection not painful, no injection resistance, negative IV test and no paresthesia  Additional Notes Patient identified.  Risk benefits discussed including failed block, incomplete pain control, headache, nerve damage, paralysis, blood pressure changes, nausea, vomiting, reactions to medication both toxic or allergic, and postpartum back pain.  Patient expressed understanding and wished to proceed.  All questions were answered.  Sterile technique used throughout procedure and epidural site dressed with sterile barrier dressing. No paresthesia or other complications noted.The patient did not experience any signs of intravascular injection such as tinnitus or metallic taste in mouth nor signs of intrathecal spread such as rapid motor block. Please see nursing notes for vital signs.

## 2012-06-22 NOTE — Progress Notes (Signed)
1305 Dr Despina Hidden called to clarify orders and to sign consent for PPTL, Dr Despina Hidden unable to come at this time but states he will try to come during this 24 hours and sign consent for PPTL for pt and that pt is consented for Medicaid and is scheduled for Monday for PPTL.

## 2012-06-22 NOTE — Progress Notes (Signed)
Booker CNM updated on UC pattern, FHR, pt pain level, and SVE.  No new orders given.

## 2012-06-22 NOTE — Progress Notes (Signed)
Pt tearful and upset about flat nipples and baby appears to be breastfeeding well. Pt does not have confidence it is going well, Vernona Rieger in Lactation requested to see pt now.

## 2012-06-22 NOTE — Progress Notes (Signed)
Booker CNM updated that pt desires to ambulate.  Orders given to allow ambulation.  May use intermittent monitoring during ambulation.

## 2012-06-23 ENCOUNTER — Encounter (HOSPITAL_COMMUNITY)
Admission: AD | Disposition: A | Discharge status: Home / Self Care | Payer: Self-pay | Source: Ambulatory Visit | Attending: Obstetrics & Gynecology

## 2012-06-23 ENCOUNTER — Inpatient Hospital Stay (HOSPITAL_COMMUNITY): Payer: Medicaid Other

## 2012-06-23 ENCOUNTER — Encounter (HOSPITAL_COMMUNITY): Payer: Self-pay | Admitting: Anesthesiology

## 2012-06-23 ENCOUNTER — Encounter (HOSPITAL_COMMUNITY): Payer: Self-pay

## 2012-06-23 LAB — CBC
HCT: 22.2 % — ABNORMAL LOW (ref 36.0–46.0)
MCH: 21.5 pg — ABNORMAL LOW (ref 26.0–34.0)
MCV: 68.3 fL — ABNORMAL LOW (ref 78.0–100.0)
RBC: 3.25 MIL/uL — ABNORMAL LOW (ref 3.87–5.11)
WBC: 8.5 10*3/uL (ref 4.0–10.5)

## 2012-06-23 SURGERY — LIGATION, FALLOPIAN TUBE, POSTPARTUM
Anesthesia: Epidural | Laterality: Bilateral

## 2012-06-23 MED ORDER — ONDANSETRON HCL 4 MG/2ML IJ SOLN
INTRAMUSCULAR | Status: AC
  Filled 2012-06-23: qty 2

## 2012-06-23 MED ORDER — LACTATED RINGERS IV SOLN: INTRAVENOUS | Status: DC

## 2012-06-23 MED ORDER — LIDOCAINE-EPINEPHRINE (PF) 2 %-1:200000 IJ SOLN
INTRAMUSCULAR | Status: AC
  Filled 2012-06-23: qty 20

## 2012-06-23 MED ORDER — MIDAZOLAM HCL 2 MG/2ML IJ SOLN
INTRAMUSCULAR | Status: AC
  Filled 2012-06-23: qty 2

## 2012-06-23 MED ORDER — FENTANYL CITRATE 0.05 MG/ML IJ SOLN
INTRAMUSCULAR | Status: AC
  Filled 2012-06-23: qty 2

## 2012-06-23 MED ORDER — FAMOTIDINE 20 MG PO TABS
40.0000 mg | ORAL_TABLET | Freq: Once | ORAL | Status: AC
  Administered 2012-06-23: 40 mg via ORAL
  Filled 2012-06-23 (×2): qty 1

## 2012-06-23 MED ORDER — SODIUM BICARBONATE 8.4 % IV SOLN
INTRAVENOUS | Status: AC
  Filled 2012-06-23: qty 50

## 2012-06-23 MED ORDER — LIDOCAINE HCL (CARDIAC) 20 MG/ML IV SOLN
INTRAVENOUS | Status: AC
  Filled 2012-06-23: qty 5

## 2012-06-23 MED ORDER — METOCLOPRAMIDE HCL 10 MG PO TABS
10.0000 mg | ORAL_TABLET | Freq: Once | ORAL | Status: AC
  Administered 2012-06-23: 10 mg via ORAL
  Filled 2012-06-23: qty 1

## 2012-06-23 MED ORDER — DEXAMETHASONE SODIUM PHOSPHATE 10 MG/ML IJ SOLN
INTRAMUSCULAR | Status: AC
  Filled 2012-06-23: qty 1

## 2012-06-23 MED ORDER — PROPOFOL 10 MG/ML IV EMUL
INTRAVENOUS | Status: AC
  Filled 2012-06-23: qty 20

## 2012-06-23 MED ORDER — TETANUS-DIPHTH-ACELL PERTUSSIS 5-2.5-18.5 LF-MCG/0.5 IM SUSP
0.5000 mL | Freq: Once | INTRAMUSCULAR | Status: AC
  Administered 2012-06-24: 0.5 mL via INTRAMUSCULAR
  Filled 2012-06-23: qty 0.5

## 2012-06-23 MED ORDER — KETOROLAC TROMETHAMINE 30 MG/ML IJ SOLN
INTRAMUSCULAR | Status: AC
  Filled 2012-06-23: qty 1

## 2012-06-23 SURGICAL SUPPLY — 18 items
CHLORAPREP W/TINT 26ML (MISCELLANEOUS) IMPLANT
CONTAINER PREFILL 10% NBF 15ML (MISCELLANEOUS) IMPLANT
GLOVE BIOGEL PI IND STRL 6.5 (GLOVE) IMPLANT
GLOVE BIOGEL PI INDICATOR 6.5 (GLOVE)
GLOVE SURG SS PI 6.0 STRL IVOR (GLOVE) IMPLANT
GOWN PREVENTION PLUS LG XLONG (DISPOSABLE) IMPLANT
NEEDLE HYPO 25X1 1.5 SAFETY (NEEDLE) IMPLANT
NS IRRIG 1000ML POUR BTL (IV SOLUTION) IMPLANT
PACK ABDOMINAL MINOR (CUSTOM PROCEDURE TRAY) IMPLANT
SPONGE LAP 4X18 X RAY DECT (DISPOSABLE) IMPLANT
SUT PLAIN 0 NONE (SUTURE) IMPLANT
SUT VIC AB 0 CT1 27 (SUTURE)
SUT VIC AB 0 CT1 27XBRD ANBCTR (SUTURE) IMPLANT
SUT VIC AB 3-0 PS2 18 (SUTURE) IMPLANT
SYR CONTROL 10ML LL (SYRINGE) IMPLANT
TOWEL OR 17X24 6PK STRL BLUE (TOWEL DISPOSABLE) IMPLANT
TRAY FOLEY CATH 14FR (SET/KITS/TRAYS/PACK) IMPLANT
WATER STERILE IRR 1000ML POUR (IV SOLUTION) IMPLANT

## 2012-06-23 NOTE — Anesthesia Postprocedure Evaluation (Signed)
  Anesthesia Post Note  Patient: Paige Bates  Procedure(s) Performed: Procedure(s) (LRB): POST PARTUM TUBAL LIGATION (Bilateral)  Anesthesia type: Epidural  Patient location: Mother/Baby  Post pain: Pain level controlled  Post assessment: Post-op Vital signs reviewed  Last Vitals:  Filed Vitals:   06/23/12 0556  BP: 119/75  Pulse: 82  Temp: 36.5 C  Resp: 18    Post vital signs: Reviewed  Level of consciousness: awake  Complications: No apparent anesthesia complications

## 2012-06-23 NOTE — Anesthesia Preprocedure Evaluation (Deleted)
Anesthesia Evaluation  Patient identified by MRN, date of birth, ID band Patient awake    Reviewed: Allergy & Precautions, H&P , Patient's Chart, lab work & pertinent test results  Airway Mallampati: III TM Distance: >3 FB Neck ROM: full    Dental no notable dental hx.    Pulmonary neg pulmonary ROS,  breath sounds clear to auscultation  Pulmonary exam normal       Cardiovascular negative cardio ROS  Rhythm:regular Rate:Normal     Neuro/Psych PSYCHIATRIC DISORDERS Depression negative neurological ROS  negative psych ROS   GI/Hepatic negative GI ROS, Neg liver ROS, GERD-  ,(+) Hepatitis -, B  Endo/Other  negative endocrine ROSMorbid obesity  Renal/GU negative Renal ROS     Musculoskeletal   Abdominal   Peds  Hematology negative hematology ROS (+) anemia ,   Anesthesia Other Findings   Reproductive/Obstetrics                           Anesthesia Physical  Anesthesia Plan  ASA: III  Anesthesia Plan: Epidural   Post-op Pain Management:    Induction:   Airway Management Planned:   Additional Equipment:   Intra-op Plan:   Post-operative Plan:   Informed Consent: I have reviewed the patients History and Physical, chart, labs and discussed the procedure including the risks, benefits and alternatives for the proposed anesthesia with the patient or authorized representative who has indicated his/her understanding and acceptance.     Plan Discussed with:   Anesthesia Plan Comments:         Anesthesia Quick Evaluation

## 2012-06-23 NOTE — Progress Notes (Signed)
Post Partum Day 1 Subjective: up ad lib, voiding, tolerating PO, + flatus and pt can states the epidural is bothering her, and that she has not been able to eat much because she doesn't like the food.  She has been NPO since midnight  Objective: Blood pressure 119/75, pulse 82, temperature 97.7 F (36.5 C), temperature source Oral, resp. rate 18, height 5\' 5"  (1.651 m), weight 106.777 kg (235 lb 6.4 oz), last menstrual period 09/24/2011, SpO2 98.00%, unknown if currently breastfeeding.  Physical Exam:  General: alert, cooperative and no distress Lochia: appropriate Uterine Fundus: firm Incision: NA DVT Evaluation: No evidence of DVT seen on physical exam.   Recent Labs  06/22/12 0313 06/23/12 0653  HGB 7.8* 7.0*  HCT 24.9* 22.2*    Assessment/Plan: Plan for discharge tomorrow and Contraception BTL today   LOS: 2 days   Tawnya Crook 06/23/2012, 7:17 AM

## 2012-06-23 NOTE — Anesthesia Postprocedure Evaluation (Signed)
  Anesthesia Post-op Note  Patient: Paige Bates  Procedure(s) Performed: * No procedures listed *  Patient Location: Mother/Baby  Anesthesia Type:Epidural  Level of Consciousness: awake  Airway and Oxygen Therapy: Patient Spontanous Breathing  Post-op Pain: none  Post-op Assessment: Patient's Cardiovascular Status Stable, Respiratory Function Stable, Patent Airway, No signs of Nausea or vomiting, Adequate PO intake, Pain level controlled, No headache, No backache, No residual numbness and No residual motor weakness  Post-op Vital Signs: Reviewed and stable  Complications: No apparent anesthesia complications

## 2012-06-23 NOTE — Anesthesia Postprocedure Evaluation (Signed)
  Anesthesia Post-op Note  Patient: Paige Bates  Procedure(s) Performed: * No procedures listed *  Patient Location: PACU and Mother/Baby  Anesthesia Type:Epidural  Level of Consciousness: awake, alert  and oriented  Airway and Oxygen Therapy: Patient Spontanous Breathing  Post-op Pain: none  Post-op Assessment: Post-op Vital signs reviewed and Patient's Cardiovascular Status Stable  Post-op Vital Signs: Reviewed and stable  Complications: No apparent anesthesia complications, epidural catheter removed, tip intact.

## 2012-06-23 NOTE — Progress Notes (Signed)
UR chart review completed.  

## 2012-06-23 NOTE — Progress Notes (Signed)
After further consideration, while in the pre-op area, patient became undecided with permanent sterilization and opted to cancel the procedure. Patient will use Nexplanon instead for birth control. Patient has used Implanon in the past and was happy with that method.

## 2012-06-23 NOTE — Progress Notes (Signed)
Patient ID: Paige Bates, female   DOB: Dec 21, 1985, 27 y.o.   MRN: [id number] 27 y.o. yo [phone]  with undesired fertility,status post vaginal delivery, desires permanent sterilization. Risks and benefits of procedure discussed with patient including permanence of method, bleeding, infection, injury to surrounding organs and need for additional procedures. Risk failure of 0.5-1% with increased risk of ectopic gestation if pregnancy occurs was also discussed with patient. Patient verbalized understanding and all questions were answered.

## 2012-06-24 DIAGNOSIS — M79609 Pain in unspecified limb: Secondary | ICD-10-CM

## 2012-06-24 LAB — CBC
HCT: 23.7 % — ABNORMAL LOW (ref 36.0–46.0)
Hemoglobin: 7.3 g/dL — ABNORMAL LOW (ref 12.0–15.0)
MCH: 21.2 pg — ABNORMAL LOW (ref 26.0–34.0)
MCHC: 30.8 g/dL (ref 30.0–36.0)
RDW: 17.3 % — ABNORMAL HIGH (ref 11.5–15.5)

## 2012-06-24 MED ORDER — OXYCODONE-ACETAMINOPHEN 5-325 MG PO TABS: 1.0000 | ORAL_TABLET | ORAL | Status: DC | PRN

## 2012-06-24 MED ORDER — IBUPROFEN 600 MG PO TABS: 600.0000 mg | ORAL_TABLET | Freq: Four times a day (QID) | ORAL | Status: DC | PRN

## 2012-06-24 MED ORDER — DOCUSATE SODIUM 100 MG PO CAPS: 100.0000 mg | ORAL_CAPSULE | Freq: Two times a day (BID) | ORAL | Status: DC | PRN

## 2012-06-24 MED ORDER — FERROUS SULFATE 325 (65 FE) MG PO TABS: 325.0000 mg | ORAL_TABLET | Freq: Three times a day (TID) | ORAL | Status: DC

## 2012-06-24 MED ORDER — PRENATAL MULTIVITAMIN CH: 1.0000 | ORAL_TABLET | Freq: Every day | ORAL | Status: DC

## 2012-06-24 NOTE — Discharge Summary (Signed)
Obstetric Discharge Summary Reason for Admission: onset of labor Prenatal Procedures: none Intrapartum Procedures: spontaneous vaginal delivery Postpartum Procedures: none Complications-Operative and Postpartum: second degree perineal laceration Hemoglobin  Date Value Range Status  06/23/2012 7.0* 12.0 - 15.0 g/dL Final     HCT  Date Value Range Status  06/23/2012 22.2* 36.0 - 46.0 % Final    Physical Exam:  General: alert, cooperative and mild distress Lochia: appropriate Uterine Fundus: firm Incision: n/a DVT Evaluation: Posterior calf tenderness present bilaterally - will obtain bilateral dopplers   Hospital Course: Paige Bates is a 27 y.o. [id number] at [redacted]w[redacted]d who presented in labor and delivered a viable female via spontaneous vaginal delivery. On postpartum day one she was to undergo a bilateral tubal ligation, however patient changed her mind and will instead use Nexplanon for birth control. She is breast and bottle feeding her infant. Due to some calf tenderness on the day of discharge, we obtained bilateral lower extremity dopplers which showed no evidence of DVT, superficial thrombosis, or Baker's Cyst.  She was then deemed stable for discharge to home.   Discharge Diagnoses: Term Pregnancy-delivered, hepatitis B  Discharge Information: Date: 06/24/2012 Activity: pelvic rest Diet: routine Medications: PNV, Ibuprofen, Colace, Iron and Percocet Condition: stable Instructions: refer to practice specific booklet Discharge to: home   Newborn Data: Live born female  Birth Weight: 9 lb 3.8 oz (4190 g) APGAR: 9, 9  Home with mother.  Levert Feinstein 06/24/2012, 7:40 AM  Attestation of Attending Supervision of Resident: Evaluation and management procedures were performed by the [facility] Resident under my supervision.  I have seen and examined the patient, reviewed the resident's note and chart, and I agree with the management and plan.  Jaynie Collins, MD,  FACOG Attending Obstetrician & Gynecologist Faculty Practice, [facility] [place]

## 2012-06-24 NOTE — Progress Notes (Signed)
Patient was referred for history of depression/anxiety. * Referral screened out by Clinical Social Worker because none of the following criteria appear to apply:  ~ History of anxiety/depression during this pregnancy, or of post-partum depression.  ~ Diagnosis of anxiety and/or depression within last 3 years  ~ History of depression due to pregnancy loss/loss of child  OR * Patient's symptoms currently being treated with medication and/or therapy.  Please contact the Clinical Social Worker if needs arise, or by the patient's request. Pt's PP depression was situational, as per pt.  She denies any depression since then.  She uses her pastor as resource when needed.  Pt states she is fine now.  No barriers to discharge.  

## 2012-06-24 NOTE — Progress Notes (Signed)
VASCULAR LAB PRELIMINARY  PRELIMINARY  PRELIMINARY  PRELIMINARY  Bilateral lower extremity venous duplex  completed.    Preliminary report:  Bilateral:  No evidence of DVT, superficial thrombosis, or Baker's Cyst.    Paige Bates, RVT 06/24/2012, 11:07 AM

## 2012-06-27 ENCOUNTER — Ambulatory Visit (HOSPITAL_COMMUNITY): Admit: 2012-06-27 | Payer: Medicaid Other

## 2012-07-07 ENCOUNTER — Ambulatory Visit (HOSPITAL_COMMUNITY): Admission: RE | Admit: 2012-07-07 | Payer: Medicaid Other | Source: Ambulatory Visit

## 2012-07-07 ENCOUNTER — Telehealth: Payer: Self-pay

## 2012-07-07 NOTE — Telephone Encounter (Signed)
Patient called in requesting thrush medication for her breasts.  Her baby was dx with thrush and she was told she should also get a medication to make sure it clears up.  She is c/o painful nipples associated with the thrush.  Her pharmacy is [facility] on [address].

## 2012-07-07 NOTE — Telephone Encounter (Signed)
Called pt and I informed pt that per provider she can put Lotrimin cream that she can purchase otc and apply it to her breast and to follow directions.  Pt stated "ok".

## 2012-07-08 ENCOUNTER — Ambulatory Visit (HOSPITAL_COMMUNITY)
Admission: RE | Admit: 2012-07-08 | Discharge: 2012-07-08 | Disposition: A | Discharge status: Home / Self Care | Payer: Medicaid Other | Source: Ambulatory Visit | Attending: Obstetrics & Gynecology | Admitting: Obstetrics & Gynecology

## 2012-07-08 NOTE — Lactation Note (Signed)
Adult Lactation Consultation Outpatient Visit Note                                                                                                               Patient Name: Paige Bates            "[name]" Date of Birth: 1985-11-01                        todays weight: 4-0,9811 Gestational Age at Delivery: Unknown Type of Delivery: vaginal  Breastfeeding History: Frequency of Breastfeeding: every 2-3 hours Length of Feeding: 20-60 mins.  Voids: 6-7 Stools: no stool since friday  Supplementing / Method: supplement with 3 ounces of formula 3 times daily Pumping:  Type of Pump:Mother has been using hand pump until today. She now has a [place] from [facility].   Frequency: only a few times with small amts.  Volume:    Comments: infant has thrush and has been treated for 2-3 day with Nystatin .   Mother having concerns about milk supply and infant getting enough. Mother has been using a nipple shield since hospital . Mother has inverted nipple. She has a #20 nipple shield that she is using. Mother also describes very sore nipples.  Mothers breast are filling but not full. She states she fed infant 2 hours ago. Mother has large crack at the back of (R) nipple shaft. She also has a smaller crack in center of (L) nipple.    Consultation Evaluation: mother reviewed application of nipple shield and proper latch technique. Infant latched well with deep latch using #24 nipple shield. infant sustained latch for 20 mins and transferred 28 ml. Observed frequent audible swallows and milk in tip of shield.  Reviewed importance of breast compression.  Initial Feeding Assessment: Pre-feed [id number]:9562 Post-feed ZHYQMV:7846 Amount Transferred:51ml Comments:  Additional Feeding Assessment: infant latched to (L) breast for 15 mins with good suck swallow pattern.  infant had very large green stool and wet diaper while feeding. Pre-feed [id number]:8413 Post-feed KGMWNU:2725 Amount  Transferred: Comments:  Additional Feeding Assessment: infant was fed 45 ml of formula with bottle for remainder of feeding.  Pre-feed Weight: Post-feed Weight: Amount Transferred: Comments:  Total Breast milk Transferred this Visit: 28ml Total Supplement Given: 45ml  Additional Interventions:  Mother inst to offer breast to infant using nipple shield every 2-3 hours. Mother inst to supplement infant with bottle after each feeding giving him at least 45 ml EBM/formula. Mother inst to pump breast after each feeding for 20-25 mins. inst mother to hand express for 2-3 mins after pumping to remove more milk.  Mother to call Ob for all purpose nipple cream and to get RX for Diflucan Mother to nap frequently and ask family for help with other children.  Follow-Up  May 19 at 10:30     Stevan Born [facility] 07/08/2012, 1:07 PM

## 2012-07-11 ENCOUNTER — Ambulatory Visit (HOSPITAL_COMMUNITY): Payer: Medicaid Other

## 2012-07-14 ENCOUNTER — Ambulatory Visit (HOSPITAL_COMMUNITY)
Admission: RE | Admit: 2012-07-14 | Discharge: 2012-07-14 | Disposition: A | Discharge status: Home / Self Care | Payer: Medicaid Other | Source: Ambulatory Visit | Attending: Obstetrics & Gynecology | Admitting: Obstetrics & Gynecology

## 2012-07-14 NOTE — Lactation Note (Signed)
Adult Lactation Consultation Outpatient Visit Note  Patient Name: Paige Bates          "Wendi Snipes" Date of Birth: 05/03/1985                       todays weight: 9-14 Gestational Age at Delivery: Unknown Type of Delivery: vaginal del  Breastfeeding History: Frequency of Breastfeeding: every 2-3 hours Length of Feeding: 45 mins Voids: 8 Stools: 1 daily greenish yellow  Supplementing / Method: Pumping:  Type of Pump:Lactina    Frequency:4 times daily  Volume:  3 ounces  Comments:  Mother was unable to get Diflucan and APNO. She states Dr Olivia Mackie office failed to return her call. Mother still has bilateral cracks. She is using #24 nipple shield . (R) nipple is inverted. Mother has a difficult time getting infant to take entire nipple.  Mothers nipples and areola still pink. Mother describes painfull latch and painfull pumping. She is using a #30 flange. She states nipple burn after pumping.    Consultation Evaluation: assist with proper latch on (R) side, multiple attempts to get nipple out and infant latched to entire nipple. Infant transferred 26ml.  Initial Feeding Assessment: Pre-feed ZOXWRU:0454 Post-feed UJWJXB:1478 Amount Transferred:79ml Comments:  Additional Feeding Assessment: infant latched to (L) breast for deeper latch with nipple shield. Infant pushing back. Removed nipple shield and infant sustained latch for 10 mins and transferred 24ml. Pre-feed GNFAOZ:3086 Post-feed VHQION:6295 Amount Transferred:71ml Comments:  Additional Feeding Assessment:SNS was sat up with 40 ml infant took 35ml and another 38 ml from mother. Pre-feed MWUXLK:4401 Post-feed UUVOZD:6644 Amount Transferred:90ml Comments:  Total Breast milk Transferred this Visit: 88 ml from breast Total Supplement Given: SNS 35 ml formula Total feeding: 123 ml  Additional Interventions: Mother inst to pick up Diflucan and APNO today at [facility]. inst mother to continue to use nipple shield until  nipples are healed.  Mother inst to use SNS as often as she can for supplementing infant.inst to give 60 ml of EBM or formula after each feeding. Mother encouraged to use fenugreek as supplement to increase milk supply.  Mother encouraged to continue to massage breast for 5 mins and pump at least 4 times daily for 20-25 mins.    Follow-Up  May 29 at 10:30    Stevan Born [facility] 07/14/2012, 11:07 AM

## 2012-07-23 ENCOUNTER — Encounter: Payer: Self-pay | Admitting: Obstetrics & Gynecology

## 2012-07-23 ENCOUNTER — Other Ambulatory Visit: Payer: Self-pay | Admitting: Obstetrics & Gynecology

## 2012-07-23 ENCOUNTER — Ambulatory Visit: Payer: Medicaid Other | Admitting: Obstetrics & Gynecology

## 2012-07-23 DIAGNOSIS — Z3201 Encounter for pregnancy test, result positive: Secondary | ICD-10-CM

## 2012-07-23 NOTE — Progress Notes (Unsigned)
Patient ID: Paige Bates, female   DOB: 09/11/1985, 27 y.o.   MRN: [id number] Subjective:     Paige Bates is a 27 y.o. female who presents for a postpartum visit. She is 5 weeks postpartum following a spontaneous vaginal delivery. I have fully reviewed the prenatal and intrapartum course. The delivery was at 39 gestational weeks. Outcome: spontaneous vaginal delivery. Anesthesia: epidural. Postpartum course has been normal. Baby's course has been good. Baby is feeding by breast. Bleeding staining only. Bowel function is normal. Bladder function is normal. Patient is not sexually active. Contraception method is abstinence. Postpartum depression screening: negative.  The following portions of the patient's history were reviewed and updated as appropriate: allergies, current medications, past family history, past medical history, past social history, past surgical history and problem list.  Review of Systems Pertinent items are noted in HPI.   Objective:    BP 126/82  Pulse 89  Temp(Src) 97 F (36.1 C)  Ht 5\' 5"  (1.651 m)  Wt 216 lb (97.977 kg)  BMI 35.94 kg/m2  Breastfeeding? Yes  General:  alert, cooperative and no distress   Breasts:    Lungs:    Heart:     Abdomen: soft, non-tender; bowel sounds normal; no masses,  no organomegaly   Vulva:  normal  Vagina: normal vagina  Cervix:  no lesions  Corpus: normal  Adnexa:  normal adnexa  Rectal Exam: Not performed.        Assessment:     5 week postpartum exam. Pap smear not done at today's visit.   Plan:    1. Contraception: abstinence 2. Positive UCG today do beta HCG 3. Follow up in: 1 week or as needed. consider [place] after HCG if positive  Adam Phenix, MD 07/23/2012

## 2012-07-24 ENCOUNTER — Ambulatory Visit (HOSPITAL_COMMUNITY): Admission: RE | Admit: 2012-07-24 | Payer: Medicaid Other | Source: Ambulatory Visit

## 2012-07-24 LAB — HCG, QUANTITATIVE, PREGNANCY: hCG, Beta Chain, Quant, S: <2 m[IU]/mL

## 2012-07-31 ENCOUNTER — Encounter: Payer: Self-pay | Admitting: *Deleted

## 2012-07-31 ENCOUNTER — Ambulatory Visit (INDEPENDENT_AMBULATORY_CARE_PROVIDER_SITE_OTHER): Payer: Medicaid Other | Admitting: Obstetrics and Gynecology

## 2012-07-31 VITALS — BP 130/83 | HR 82 | Temp 100.5°F | Ht 64.0 in | Wt 217.0 lb

## 2012-07-31 DIAGNOSIS — D649 Anemia, unspecified: Secondary | ICD-10-CM

## 2012-07-31 DIAGNOSIS — Z30017 Encounter for initial prescription of implantable subdermal contraceptive: Secondary | ICD-10-CM

## 2012-07-31 LAB — CBC
HCT: 36.1 % (ref 36.0–46.0)
Hemoglobin: 11.3 g/dL — ABNORMAL LOW (ref 12.0–15.0)
MCH: 22.2 pg — ABNORMAL LOW (ref 26.0–34.0)
MCHC: 31.3 g/dL (ref 30.0–36.0)
RDW: 22.5 % — ABNORMAL HIGH (ref 11.5–15.5)

## 2012-07-31 MED ORDER — ETONOGESTREL 68 MG ~~LOC~~ IMPL
68.0000 mg | DRUG_IMPLANT | Freq: Once | SUBCUTANEOUS | Status: AC
  Administered 2012-07-31: 68 mg via SUBCUTANEOUS

## 2012-07-31 NOTE — Progress Notes (Signed)
Patient ID: EMREE LOCICERO, female   DOB: Jan 14, 1986, 27 y.o.   MRN: [id number] 27 yo [id number] who is 6 weeks postpartum presenting today for Nexplanon insertion. Patient understands that she may experience irregular spotting for the 3 years that the Nexplanon is in place. Patient has used this form of birth control twice prior to pregnancy.  Patient given informed consent, signed copy in the chart, time out was performed. Pregnancy test was negative. Appropriate time out taken.  Patient's left arm was prepped and draped in the usual sterile fashion. The ruler used to measure and mark insertion area.  Pt was prepped with alcohol swab and then injected with 1 cc of 1% lidocaine with epinephrine.  Pt was prepped with betadine, Implanon removed form packaging,  Device confirmed in needle, then inserted full length of needle and withdrawn per handbook instructions.  Pt insertion site covered with streri strip and pressure bandage.   Minimal blood loss.  Pt tolerated the procedure well.    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 That was ordered today. Patient can return to work at this point

## 2012-08-04 ENCOUNTER — Telehealth: Payer: Self-pay | Admitting: *Deleted

## 2012-08-04 NOTE — Telephone Encounter (Signed)
Pt informed. She requested that I send results to the Medical Dept at her work. Results sent to fax number [phone].

## 2012-08-04 NOTE — Telephone Encounter (Signed)
Message copied by Mannie Stabile on Mon Aug 04, 2012 11:19 AM ------      Message from: Catalina Antigua      Created: Fri Aug 01, 2012  9:05 AM       Please inform patient that her anemia has resolved            Peggy ------

## 2012-08-12 ENCOUNTER — Encounter: Payer: Self-pay | Admitting: *Deleted

## 2013-03-11 IMAGING — US US OB NUCHAL TRANSLUCENCY 1ST GEST
1 series · 13 of 28 positions shown · non-contrast
Comparison: none

[Series 1: us ob nuchal translucency 1st gest · 0.14mm/px · 13 of 37 slices shown]
[im 2/37]
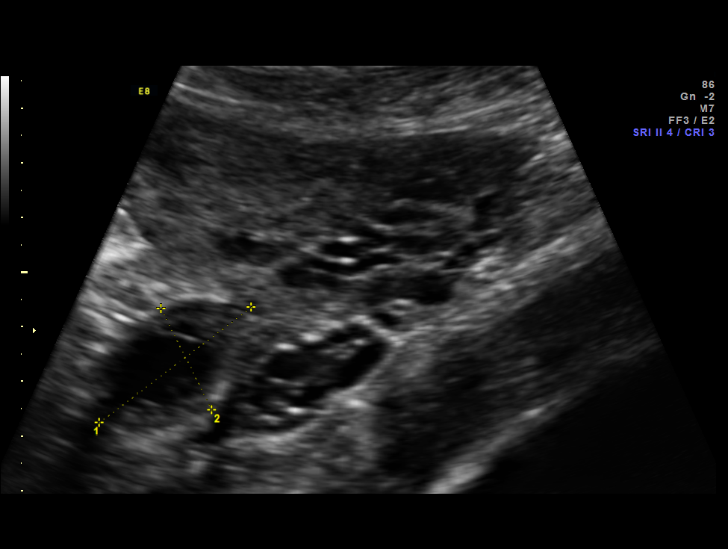
[im 5/37]
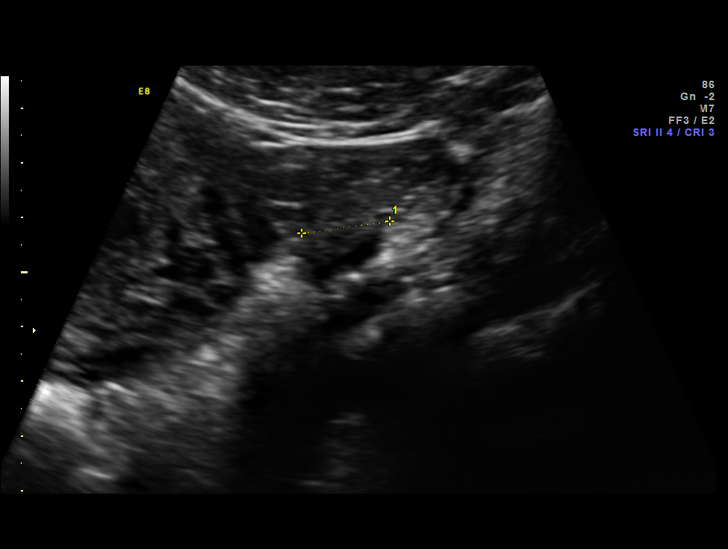
[im 7/37]
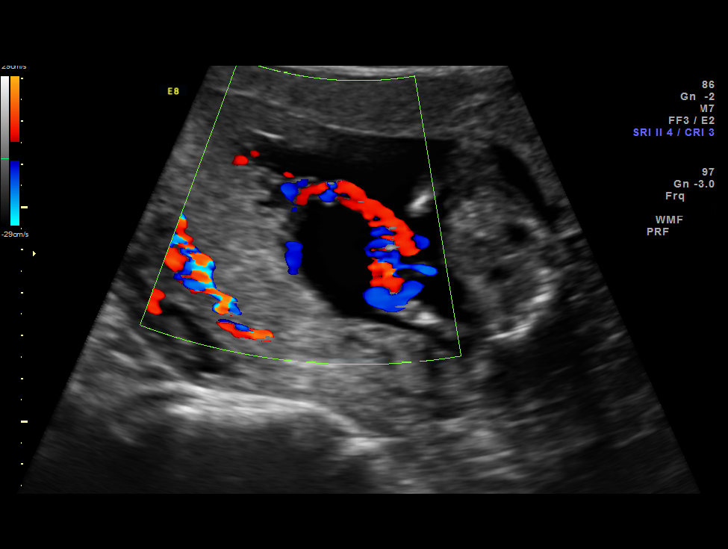
[im 10/37]
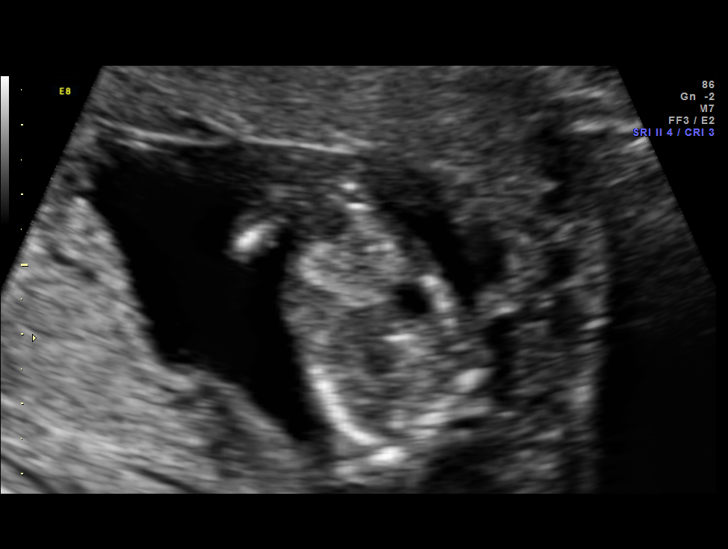
[im 13/37]
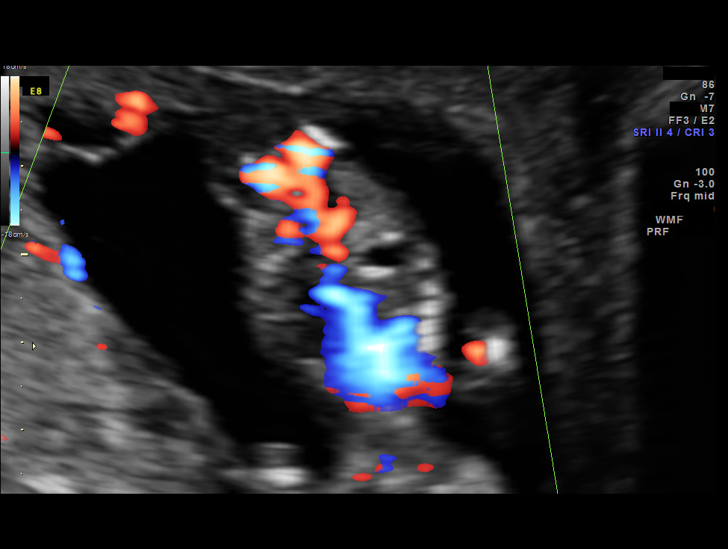
[im 15/37]
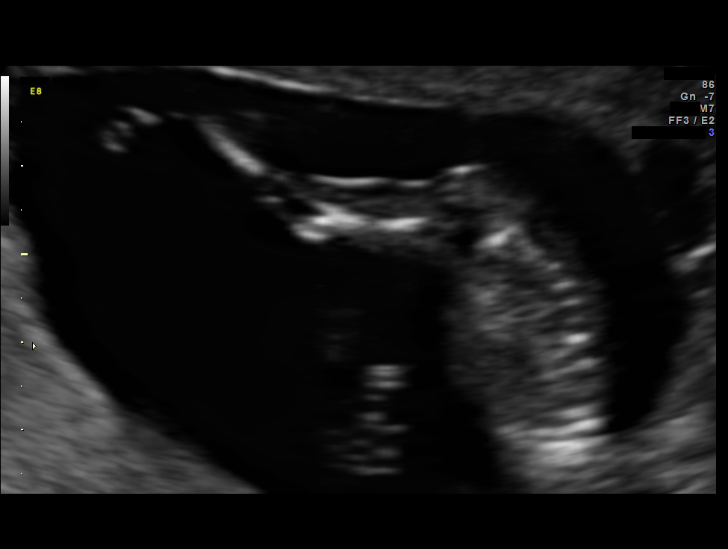
[im 19/37]
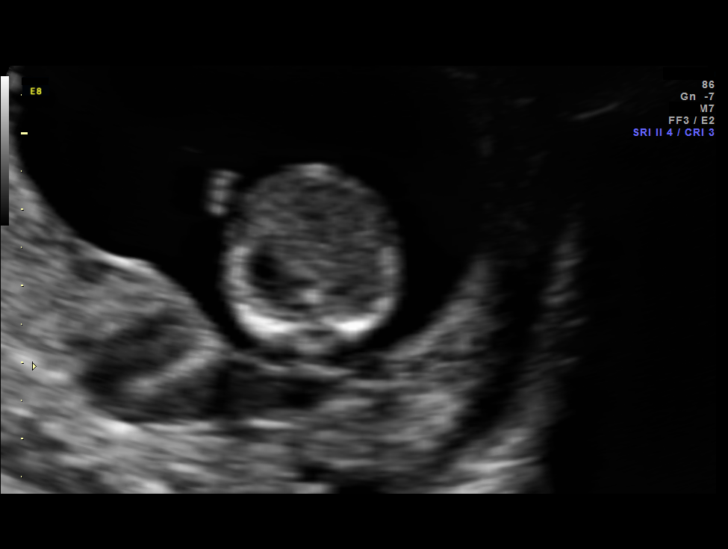
[im 22/37]
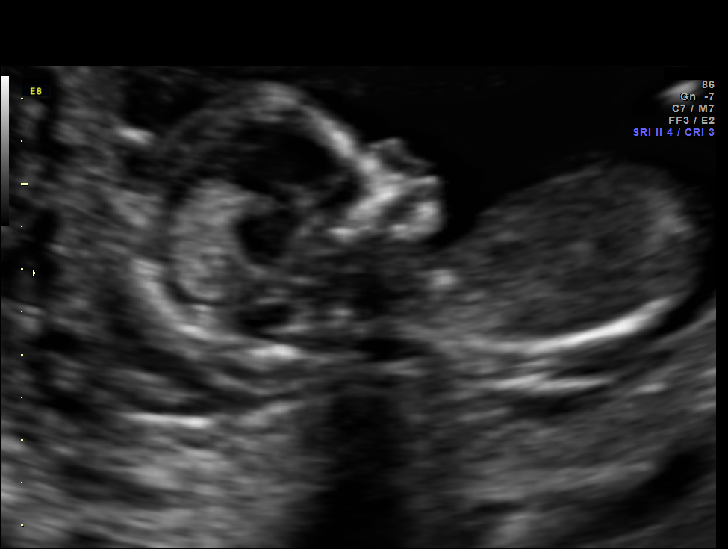
[im 25/37]
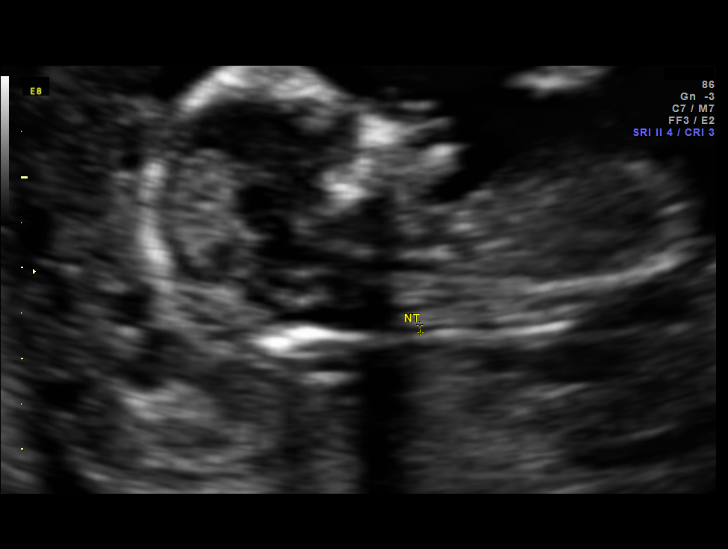
[im 27/37]
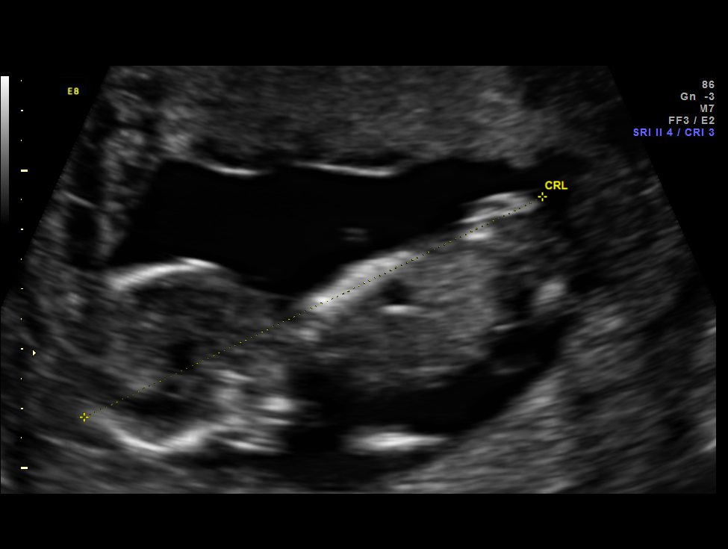
[im 30/37]
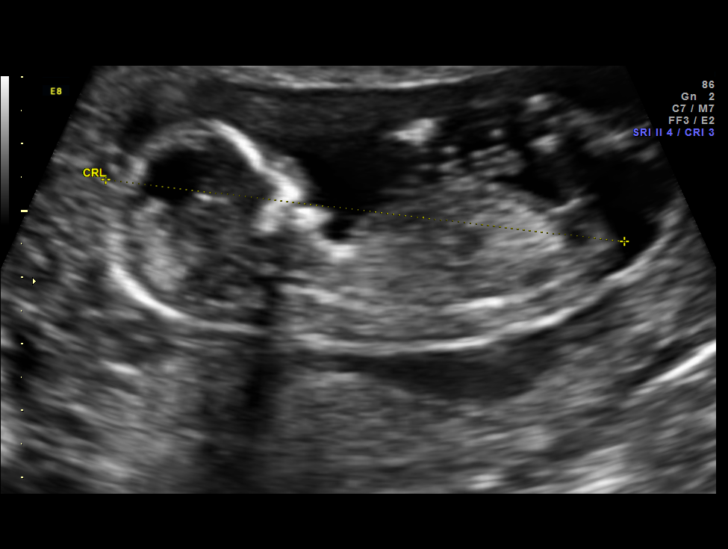
[im 33/37]
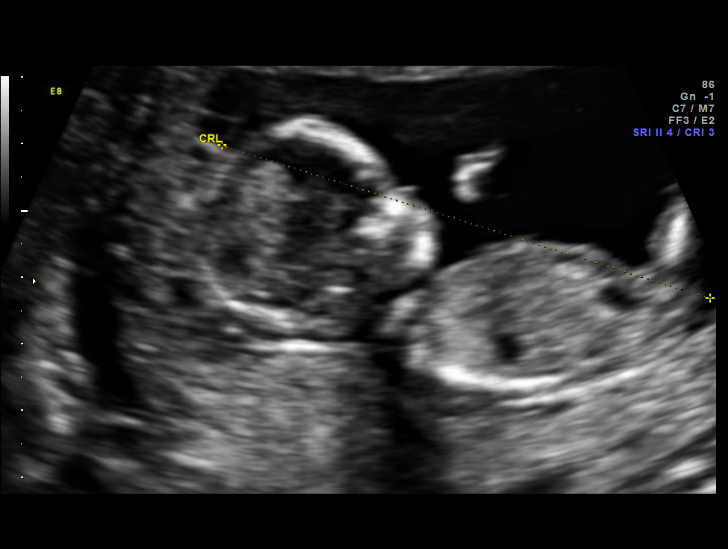
[im 35/37]
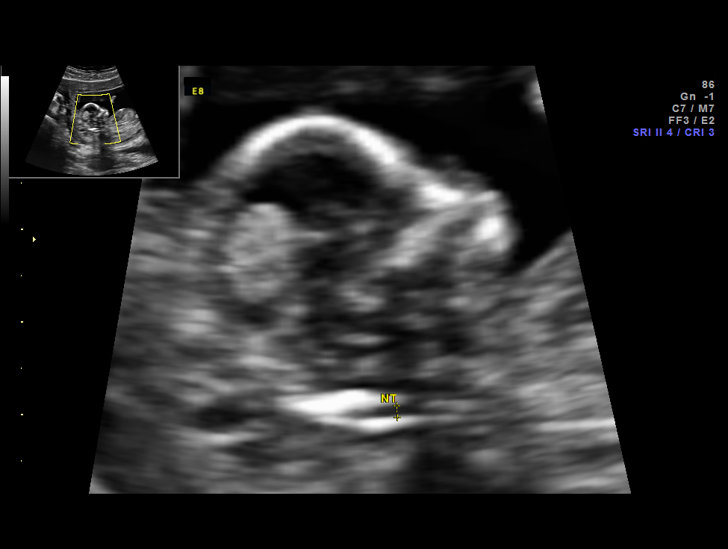

[13 of 28 positions shown; findings below may reference images not displayed]

OBSTETRICS REPORT
                      (Signed Final 12/24/2011 [DATE])

                                                         Hospital Clinic-
                                                         Faculty Physician
Service(s) Provided

 US FETAL NUCHAL TRANSLUCENCY                          76813.0
 MEASUREMENT
Indications

 Hepatitis B
 First trimester aneuploidy screen (NT)
Fetal Evaluation

 Num Of Fetuses:    1
 Fetal Heart Rate:  150                          bpm
 Cardiac Activity:  Observed
 Presentation:      Variable
 Placenta:          Fundal, above cervical os
 P. Cord            Visualized, central
 Insertion:

 Amniotic Fluid
 AFI FV:      Subjectively within normal limits
Biometry

 CRL:     77.1  mm     G. Age:  13w 3d                 EDD:    06/27/12

 NT:       1.8  mm
Gestational Age

 LMP:           13w 0d        Date:  09/24/11                 EDD:   06/30/12
 Best:          13w 0d     Det. By:  LMP  (09/24/11)          EDD:   06/30/12
1st Trimester Genetic Sonogram Screening

 CRL:            77.1  mm    G. Age:   13w 3d                 EDD:   06/27/12
 Nuc Trans:       1.8  mm
Anatomy

 Stomach:          Appears normal, left   Lower             Visualized
                   sided                  Extremities:
 Kidneys:          Appear normal          Upper             Visualized
                                          Extremities:
 Bladder:          Appears normal
Impression

 [place], intrauterine pregnancy at 13w 0d on first trimester
 evaluation with limited morphologic survey that is appropriate
 for the assigned gestational age.
 The nuchal translucency measures 1.8 mm, which is normal
 for the gestational age.
 The nasal bone is seen on today's evaluation and recorded
 as present on the risk assessment.
Recommendations

 Regardless of the results of today's evaluation, completion of
 the first trimester screen analytes is both recommended and
 arranged for your patient. I also recommend a second
 trimester AFP to occur around 15-20 weeks gestational age.
 Anatomy scan has been previously scheduled to occur at
 around 20 weeks gestation.

 questions or concerns.

## 2013-04-08 ENCOUNTER — Ambulatory Visit: Payer: Medicaid Other | Admitting: Obstetrics & Gynecology

## 2013-12-28 ENCOUNTER — Encounter: Payer: Self-pay | Admitting: Obstetrics & Gynecology

## 2014-02-22 ENCOUNTER — Inpatient Hospital Stay (HOSPITAL_COMMUNITY)
Admission: AD | Admit: 2014-02-22 | Discharge: 2014-02-22 | Disposition: A | Discharge status: Home / Self Care | Payer: Medicaid Other | Source: Ambulatory Visit | Attending: Family Medicine | Admitting: Family Medicine

## 2014-02-22 ENCOUNTER — Encounter (HOSPITAL_COMMUNITY): Payer: Self-pay | Admitting: *Deleted

## 2014-02-22 ENCOUNTER — Inpatient Hospital Stay (HOSPITAL_COMMUNITY): Payer: Medicaid Other

## 2014-02-22 DIAGNOSIS — R1032 Left lower quadrant pain: Secondary | ICD-10-CM | POA: Diagnosis not present

## 2014-02-22 DIAGNOSIS — O218 Other vomiting complicating pregnancy: Secondary | ICD-10-CM

## 2014-02-22 DIAGNOSIS — O210 Mild hyperemesis gravidarum: Secondary | ICD-10-CM | POA: Insufficient documentation

## 2014-02-22 DIAGNOSIS — Z3A08 8 weeks gestation of pregnancy: Secondary | ICD-10-CM | POA: Insufficient documentation

## 2014-02-22 DIAGNOSIS — R102 Pelvic and perineal pain: Secondary | ICD-10-CM

## 2014-02-22 DIAGNOSIS — O26891 Other specified pregnancy related conditions, first trimester: Secondary | ICD-10-CM

## 2014-02-22 HISTORY — DX: Dermatitis, unspecified: L30.9

## 2014-02-22 LAB — URINALYSIS, ROUTINE W REFLEX MICROSCOPIC
BILIRUBIN URINE: NEGATIVE
Glucose, UA: NEGATIVE mg/dL
Hgb urine dipstick: NEGATIVE
Ketones, ur: NEGATIVE mg/dL
NITRITE: NEGATIVE
PH: 7 (ref 5.0–8.0)
Protein, ur: NEGATIVE mg/dL
SPECIFIC GRAVITY, URINE: 1.02 (ref 1.005–1.030)
UROBILINOGEN UA: 0.2 mg/dL (ref 0.0–1.0)

## 2014-02-22 LAB — POCT PREGNANCY, URINE: PREG TEST UR: POSITIVE — AB

## 2014-02-22 LAB — HCG, QUANTITATIVE, PREGNANCY: HCG, BETA CHAIN, QUANT, S: 62189 m[IU]/mL — AB (ref <5)

## 2014-02-22 LAB — URINE MICROSCOPIC-ADD ON

## 2014-02-22 LAB — WET PREP, GENITAL
Clue Cells Wet Prep HPF POC: NONE SEEN
Trich, Wet Prep: NONE SEEN
YEAST WET PREP: NONE SEEN

## 2014-02-22 MED ORDER — METOCLOPRAMIDE HCL 5 MG/ML IJ SOLN
10.0000 mg | Freq: Once | INTRAMUSCULAR | Status: AC
  Administered 2014-02-22: 10 mg via INTRAMUSCULAR
  Filled 2014-02-22: qty 2

## 2014-02-22 MED ORDER — DOXYLAMINE-PYRIDOXINE 10-10 MG PO TBEC: DELAYED_RELEASE_TABLET | ORAL | Status: DC

## 2014-02-22 MED ORDER — METOCLOPRAMIDE HCL 10 MG PO TABS: 10.0000 mg | ORAL_TABLET | Freq: Four times a day (QID) | ORAL | Status: DC

## 2014-02-22 MED ORDER — PRENATAL VITAMIN 27-0.8 MG PO TABS: 1.0000 | ORAL_TABLET | Freq: Every day | ORAL | Status: AC

## 2014-02-22 NOTE — MAU Note (Signed)
Bad morning sickness. Unable to keep anything down past 2 days.  Feeling very weak.  Pain in LLQ- hx of cyst on that side, hurting off and on for months.

## 2014-02-22 NOTE — Discharge Instructions (Signed)

## 2014-02-22 NOTE — MAU Provider Note (Signed)
None     Chief Complaint:  Morning Sickness   Paige Bates is  28 y.o. B1Y7829G5P3013 at 5953w0d presents complaining of Morning Sickness She is 8 weeks by LMP, and has been ubable to keep anything down for 2 days.  Also c/o LLQ pain: hx of ov cyst on L side, off and on pain for months Obstetrical/Gynecological History: OB History    Gravida Para Term Preterm AB TAB SAB Ectopic Multiple Living   5 3 3  1 1    3      Past Medical History: Past Medical History  Diagnosis Date  . Depression     no meds  . Anemia   . Preterm labor     PTL with 2nd child  . Chronic hepatitis B   . Eczema     Past Surgical History: Past Surgical History  Procedure Laterality Date  . No past surgeries    . Tooth extraction      Family History: Family History  Problem Relation Age of Onset  . Other Neg Hx     Social History: History  Substance Use Topics  . Smoking status: Never Smoker   . Smokeless tobacco: Never Used  . Alcohol Use: No    Allergies:  Allergies  Allergen Reactions  . Latex Itching and Rash    Meds:  Prescriptions prior to admission  Medication Sig Dispense Refill Last Dose  . ferrous sulfate 325 (65 FE) MG tablet Take 1 tablet (325 mg total) by mouth 3 (three) times daily with meals. 90 tablet 3 Past Month at Unknown time  . Loperamide HCl (IMODIUM PO) Take 1 tablet by mouth once.   Past Week at Unknown time  . OVER THE COUNTER MEDICATION Apply 1 application topically 2 (two) times daily. French cream for eczema.   02/22/2014 at Unknown time  . docusate sodium (COLACE) 100 MG capsule Take 1 capsule (100 mg total) by mouth 2 (two) times daily as needed for constipation. 30 capsule 0 more than one month  . ibuprofen (ADVIL,MOTRIN) 600 MG tablet Take 1 tablet (600 mg total) by mouth every 6 (six) hours as needed for pain. 30 tablet 0 more than one month  . oxyCODONE-acetaminophen (PERCOCET/ROXICET) 5-325 MG per tablet Take 1-2 tablets by mouth every 4 (four) hours as  needed. (Patient taking differently: Take 1-2 tablets by mouth every 4 (four) hours as needed for severe pain. ) 30 tablet 0 more than one month  . Prenatal Vit-Fe Fumarate-FA (PRENATAL MULTIVITAMIN) TABS Take 1 tablet by mouth daily. (Patient not taking: Reported on 02/22/2014) 30 tablet 2 Taking    Review of Systems   Constitutional: Negative for fever and chills Eyes: Negative for visual disturbances Respiratory: Negative for shortness of breath, dyspnea Cardiovascular: Negative for chest pain or palpitations  Gastrointestinal: Negative for vomiting, diarrhea and constipation Genitourinary: Negative for dysuria and urgency Musculoskeletal: Negative for back pain, joint pain, myalgias  Neurological: Negative for dizziness and headaches     Physical Exam  Blood pressure 132/70, pulse 80, temperature 98.6 F (37 C), temperature source Oral, resp. rate 18, weight 101.606 kg (224 lb), last menstrual period 12/28/2013, currently breastfeeding. GENERAL: Well-developed, well-nourished female in no acute distress.  LUNGS: Clear to auscultation bilaterally.  HEART: Regular rate and rhythm. ABDOMEN: Soft, nontender, nondistended, gravid.  EXTREMITIES: Nontender, no edema, 2+ distal pulses. DTR's 2+ PELVIC EXAM:  SSE:  Small amount of white discharge, no odor.  CERVICAL EXAM: closed   Labs: Results for orders  placed or performed during the hospital encounter of 02/22/14 (from the past 24 hour(s))  Urinalysis, Routine w reflex microscopic   Collection Time: 02/22/14  5:47 PM  Result Value Ref Range   Color, Urine YELLOW YELLOW   APPearance CLEAR CLEAR   Specific Gravity, Urine 1.020 1.005 - 1.030   pH 7.0 5.0 - 8.0   Glucose, UA NEGATIVE NEGATIVE mg/dL   Hgb urine dipstick NEGATIVE NEGATIVE   Bilirubin Urine NEGATIVE NEGATIVE   Ketones, ur NEGATIVE NEGATIVE mg/dL   Protein, ur NEGATIVE NEGATIVE mg/dL   Urobilinogen, UA 0.2 0.0 - 1.0 mg/dL   Nitrite NEGATIVE NEGATIVE   Leukocytes,  UA SMALL (A) NEGATIVE  Urine microscopic-add on   Collection Time: 02/22/14  5:47 PM  Result Value Ref Range   Squamous Epithelial / LPF MANY (A) RARE   WBC, UA 3-6 <3 WBC/hpf   RBC / HPF 0-2 <3 RBC/hpf   Bacteria, UA MANY (A) RARE   Urine-Other MUCOUS PRESENT   Pregnancy, urine POC   Collection Time: 02/22/14  5:54 PM  Result Value Ref Range   Preg Test, Ur POSITIVE (A) NEGATIVE   Imaging Studies:  Preliminary results show 8.2 week IUP with FHR 162.  Corpus luteum on Right side  Assessment: Paige Bates is  28 y.o. O1H0865G5P3013 at 5464w0d IUP with Nausea and vomiting of pregnancy, relieved with Reglan.  .  Plan: Diclegis (and prior auth) sent/faxed Phenergan 12.5mg  rx sent to pharmacy Reglan 10mg  [facility]  CRESENZO-DISHMAN,Meghna Hagmann 12/28/20156:31 PM

## 2014-02-23 LAB — HIV ANTIBODY (ROUTINE TESTING W REFLEX): HIV 1&2 Ab, 4th Generation: NONREACTIVE

## 2014-02-23 LAB — GC/CHLAMYDIA PROBE AMP
CT Probe RNA: NEGATIVE
GC Probe RNA: NEGATIVE

## 2014-02-26 NOTE — L&D Delivery Note (Signed)
Delivery Note At 7:13 PM a viable female was delivered via Vaginal, Spontaneous Delivery (Presentation: Left Occiput Anterior).  APGAR: 8, 9; weight  pending.   Placenta status: Intact, Spontaneous.  Cord: 3 vessels with the following complications: None.   Anesthesia: Epidural  Episiotomy: None Lacerations: None Est. Blood Loss (mL): 476  Mom to postpartum.  Baby to Couplet care / Skin to Skin.  Lowanda Foster 09/25/2014, 8:16 PM

## 2014-03-11 ENCOUNTER — Encounter (HOSPITAL_COMMUNITY): Payer: Self-pay | Admitting: Obstetrics and Gynecology

## 2014-03-24 ENCOUNTER — Encounter: Payer: Medicaid Other | Admitting: Advanced Practice Midwife

## 2014-03-25 ENCOUNTER — Ambulatory Visit (INDEPENDENT_AMBULATORY_CARE_PROVIDER_SITE_OTHER): Payer: Medicaid Other | Admitting: Obstetrics & Gynecology

## 2014-03-25 ENCOUNTER — Encounter: Payer: Self-pay | Admitting: Obstetrics & Gynecology

## 2014-03-25 ENCOUNTER — Other Ambulatory Visit: Payer: Self-pay | Admitting: Obstetrics & Gynecology

## 2014-03-25 VITALS — BP 104/60 | HR 89 | Temp 98.5°F | Ht 65.75 in | Wt 224.3 lb

## 2014-03-25 DIAGNOSIS — Z3682 Encounter for antenatal screening for nuchal translucency: Secondary | ICD-10-CM

## 2014-03-25 DIAGNOSIS — O0991 Supervision of high risk pregnancy, unspecified, first trimester: Secondary | ICD-10-CM

## 2014-03-25 LAB — POCT URINALYSIS DIP (DEVICE)
Bilirubin Urine: NEGATIVE
Glucose, UA: NEGATIVE mg/dL
Hgb urine dipstick: NEGATIVE
Ketones, ur: NEGATIVE mg/dL
LEUKOCYTES UA: NEGATIVE
NITRITE: NEGATIVE
PROTEIN: 30 mg/dL — AB
Specific Gravity, Urine: 1.025 (ref 1.005–1.030)
Urobilinogen, UA: 1 mg/dL (ref 0.0–1.0)
pH: 6.5 (ref 5.0–8.0)

## 2014-03-25 MED ORDER — DOXYLAMINE-PYRIDOXINE 10-10 MG PO TBEC: DELAYED_RELEASE_TABLET | ORAL | Status: DC

## 2014-03-25 MED ORDER — FLUCONAZOLE 150 MG PO TABS: 150.0000 mg | ORAL_TABLET | Freq: Once | ORAL | Status: DC

## 2014-03-25 NOTE — Patient Instructions (Signed)
Prenatal Care  WHAT IS PRENATAL CARE?  Prenatal care means health care during your pregnancy, before your baby is born. It is very important to take care of yourself and your baby during your pregnancy by:   Getting early prenatal care. If you know you are pregnant, or think you might be pregnant, call your health care provider as soon as possible. Schedule a visit for a prenatal exam.  Getting regular prenatal care. Follow your health care provider's schedule for blood and other necessary tests. Do not miss appointments.  Doing everything you can to keep yourself and your baby healthy during your pregnancy.  Getting complete care. Prenatal care should include evaluation of the medical, dietary, educational, psychological, and social needs of you and your significant other. The medical and genetic history of your family and the family of your baby's father should be discussed with your health care provider.  Discussing with your health care provider:  Prescription, over-the-counter, and herbal medicines that you take.  Any history of substance abuse, alcohol use, smoking, and illegal drug use.  Any history of domestic abuse and violence.  Immunizations you have received.  Your nutrition and diet.  The amount of exercise you do.  Any environmental and occupational hazards to which you are exposed.  History of sexually transmitted infections for both you and your partner.  Previous pregnancies you have had. WHY IS PRENATAL CARE SO IMPORTANT?  By regularly seeing your health care provider, you help ensure that problems can be identified early so that they can be treated as soon as possible. Other problems might be prevented. Many studies have shown that early and regular prenatal care is important for the health of mothers and their babies.  HOW CAN I TAKE CARE OF MYSELF WHILE I AM PREGNANT?  Here are ways to take care of yourself and your baby:   Start or continue taking your  multivitamin with 400 micrograms (mcg) of folic acid every day.  Get early and regular prenatal care. It is very important to see a health care provider during your pregnancy. Your health care provider will check at each visit to make sure that you and your baby are healthy. If there are any problems, action can be taken right away to help you and your baby.  Eat a healthy diet that includes:  Fruits.  Vegetables.  Foods low in saturated fat.  Whole grains.  Calcium-rich foods, such as milk, yogurt, and hard cheeses.  Drink 6-8 glasses of liquids a day.  Unless your health care provider tells you not to, try to be physically active for 30 minutes, most days of the week. If you are pressed for time, you can get your activity in through 10-minute segments, three times a day.  Do not smoke, drink alcohol, or use drugs. These can cause long-term damage to your baby. Talk with your health care provider about steps to take to stop smoking. Talk with a member of your faith community, a counselor, a trusted friend, or your health care provider if you are concerned about your alcohol or drug use.  Ask your health care provider before taking any medicine, even over-the-counter medicines. Some medicines are not safe to take during pregnancy.  Get plenty of rest and sleep.  Avoid hot tubs and saunas during pregnancy.  Do not have X-rays taken unless absolutely necessary and with the recommendation of your health care provider. A lead shield can be placed on your abdomen to protect your baby when   X-rays are taken in other parts of your body.  Do not empty the cat litter when you are pregnant. It may contain a parasite that causes an infection called toxoplasmosis, which can cause birth defects. Also, use gloves when working in garden areas used by cats.  Do not eat uncooked or undercooked meats or fish.  Do not eat soft, mold-ripened cheeses (Brie, Camembert, and chevre) or soft, blue-veined  cheese (Danish blue and Roquefort).  Stay away from toxic chemicals like:  Insecticides.  Solvents (some cleaners or paint thinners).  Lead.  Mercury.  Sexual intercourse may continue until the end of the pregnancy, unless you have a medical problem or there is a problem with the pregnancy and your health care provider tells you not to.  Do not wear high-heel shoes, especially during the second half of the pregnancy. You can lose your balance and fall.  Do not take long trips, unless absolutely necessary. Be sure to see your health care provider before going on the trip.  Do not sit in one position for more than 2 hours when on a trip.  Take a copy of your medical records when going on a trip. Know where a hospital is located in the city you are visiting, in case of an emergency.  Most dangerous household products will have pregnancy warnings on their labels. Ask your health care provider about products if you are unsure.  Limit or eliminate your caffeine intake from coffee, tea, sodas, medicines, and chocolate.  Many women continue working through pregnancy. Staying active might help you stay healthier. If you have a question about the safety or the hours you work at your particular job, talk with your health care provider.  Get informed:  Read books.  Watch videos.  Go to childbirth classes for you and your significant other.  Talk with experienced moms.  Ask your health care provider about childbirth education classes for you and your partner. Classes can help you and your partner prepare for the birth of your baby.  Ask about a baby doctor (pediatrician) and methods and pain medicine for labor, delivery, and possible cesarean delivery. HOW OFTEN SHOULD I SEE MY HEALTH CARE PROVIDER DURING PREGNANCY?  Your health care provider will give you a schedule for your prenatal visits. You will have visits more often as you get closer to the end of your pregnancy. An average  pregnancy lasts about 40 weeks.  A typical schedule includes visiting your health care provider:   About once each month during your first 6 months of pregnancy.  Every 2 weeks during the next 2 months.  Weekly in the last month, until the delivery date. Your health care provider will probably want to see you more often if:  You are older than 35 years.  Your pregnancy is high risk because you have certain health problems or problems with the pregnancy, such as:  Diabetes.  High blood pressure.  The baby is not growing on schedule, according to the dates of the pregnancy. Your health care provider will do special tests to make sure you and your baby are not having any serious problems. WHAT HAPPENS DURING PRENATAL VISITS?   At your first prenatal visit, your health care provider will do a physical exam and talk to you about your health history and the health history of your partner and your family. Your health care provider will be able to tell you what date to expect your baby to be born on.  Your   first physical exam will include checks of your blood pressure, measurements of your height and weight, and an exam of your pelvic organs. Your health care provider will do a Pap test if you have not had one recently and will do cultures of your cervix to make sure there is no infection.  At each prenatal visit, there will be tests of your blood, urine, blood pressure, weight, and the progress of the baby will be checked.  At your later prenatal visits, your health care provider will check how you are doing and how your baby is developing. You may have a number of tests done as your pregnancy progresses.  Ultrasound exams are often used to check on your baby's growth and health.  You may have more urine and blood tests, as well as special tests, if needed. These may include amniocentesis to examine fluid in the pregnancy sac, stress tests to check how the baby responds to contractions, or a  biophysical profile to measure your baby's well-being. Your health care provider will explain the tests and why they are necessary.  You should be tested for high blood sugar (gestational diabetes) between the 24th and 28th weeks of your pregnancy.  You should discuss with your health care provider your plans to breastfeed or bottle-feed your baby.  Each visit is also a chance for you to learn about staying healthy during pregnancy and to ask questions. Document Released: 02/15/2003 Document Revised: 02/17/2013 Document Reviewed: 04/29/2013 Exit[employer] Patient Information 2015 [employer]. This information is not intended to replace advice given to you by your health care provider. Make sure you discuss any questions you have with your health care provider.  

## 2014-03-25 NOTE — Progress Notes (Signed)
First Screen 03/30/14 @ 915a with [facility].

## 2014-03-25 NOTE — Progress Notes (Signed)
Reports a lot of pressure in vagina/cervix; patient reports one day episode of spotting last week

## 2014-03-25 NOTE — Progress Notes (Signed)
Nutrition note: 1st visit consult Pt was obese prior to pregnancy. Pt has gained 4.3# @ 4052w3d, which is wnl. Pt reports eating 3 meals & 1 snack/d. Pt is taking a PNV. Pt reports some N/V & occ heartburn. Pt received verbal & written education on general nutrition during pregnancy. Discussed wt gain goals of 11-20# or 0.5#/wk in 2nd & 3rd trimester. Pt agrees to continue taking PNV. Pt does not have WIC but plans to apply. Pt plans to BF. F/u as needed Blondell RevealLaura Rickiya Picariello, MS, RD, LDN, [facility]CLC

## 2014-03-26 LAB — GC/CHLAMYDIA PROBE AMP
CT Probe RNA: NEGATIVE
GC Probe RNA: NEGATIVE

## 2014-03-26 LAB — PRESCRIPTION MONITORING PROFILE (19 PANEL)
Amphetamine/Meth: NEGATIVE ng/mL
BUPRENORPHINE, URINE: NEGATIVE ng/mL
Barbiturate Screen, Urine: NEGATIVE ng/mL
Benzodiazepine Screen, Urine: NEGATIVE ng/mL
CREATININE, URINE: 282.19 mg/dL (ref >20.0)
Cannabinoid Scrn, Ur: NEGATIVE ng/mL
Carisoprodol, Urine: NEGATIVE ng/mL
Cocaine Metabolites: NEGATIVE ng/mL
FENTANYL URINE: NEGATIVE ng/mL
MDMA URINE: NEGATIVE ng/mL
METHAQUALONE SCREEN (URINE): NEGATIVE ng/mL
Meperidine, Ur: NEGATIVE ng/mL
Methadone Screen, Urine: NEGATIVE ng/mL
Nitrites, Initial: NEGATIVE ug/mL
Opiate Screen, Urine: NEGATIVE ng/mL
Oxycodone Screen, Ur: NEGATIVE ng/mL
PH URINE, INITIAL: 6.7 pH (ref 4.5–8.9)
Phencyclidine, Ur: NEGATIVE ng/mL
Propoxyphene: NEGATIVE ng/mL
Tapentadol, urine: NEGATIVE ng/mL
Tramadol Scrn, Ur: NEGATIVE ng/mL
Zolpidem, Urine: NEGATIVE ng/mL

## 2014-03-26 LAB — PRENATAL PROFILE (SOLSTAS)
Antibody Screen: NEGATIVE
BASOS ABS: 0 10*3/uL (ref 0.0–0.1)
Basophils Relative: 0 % (ref 0–1)
EOS PCT: 6 % — AB (ref 0–5)
Eosinophils Absolute: 0.3 10*3/uL (ref 0.0–0.7)
HEMATOCRIT: 33.5 % — AB (ref 36.0–46.0)
HEP B S AG: POSITIVE — AB
HIV: NONREACTIVE
Hemoglobin: 11.1 g/dL — ABNORMAL LOW (ref 12.0–15.0)
LYMPHS ABS: 1.4 10*3/uL (ref 0.7–4.0)
Lymphocytes Relative: 25 % (ref 12–46)
MCH: 26.7 pg (ref 26.0–34.0)
MCHC: 33.1 g/dL (ref 30.0–36.0)
MCV: 80.7 fL (ref 78.0–100.0)
MONOS PCT: 9 % (ref 3–12)
MPV: 10.5 fL (ref 8.6–12.4)
Monocytes Absolute: 0.5 10*3/uL (ref 0.1–1.0)
Neutro Abs: 3.3 10*3/uL (ref 1.7–7.7)
Neutrophils Relative %: 60 % (ref 43–77)
Platelets: 229 10*3/uL (ref 150–400)
RBC: 4.15 MIL/uL (ref 3.87–5.11)
RDW: 14.4 % (ref 11.5–15.5)
Rh Type: POSITIVE
Rubella: 3.47 Index — ABNORMAL HIGH (ref <0.90)
WBC: 5.5 10*3/uL (ref 4.0–10.5)

## 2014-03-26 LAB — CULTURE, OB URINE: Colony Count: 10000

## 2014-03-26 LAB — HEPATITIS B SURF AG CONFIRMATION: Hepatitis B Surf Ag Confirmation: POSITIVE — AB

## 2014-03-29 LAB — HEMOGLOBINOPATHY EVALUATION
HEMOGLOBIN OTHER: 0 %
HGB A2 QUANT: 2.4 % (ref 2.2–3.2)
Hgb A: 97.3 % (ref 96.8–97.8)
Hgb F Quant: 0.3 % (ref 0.0–2.0)
Hgb S Quant: 0 %

## 2014-03-30 ENCOUNTER — Ambulatory Visit (HOSPITAL_COMMUNITY)
Admission: RE | Admit: 2014-03-30 | Discharge: 2014-03-30 | Disposition: A | Discharge status: Home / Self Care | Payer: Medicaid Other | Source: Ambulatory Visit | Attending: Obstetrics & Gynecology | Admitting: Obstetrics & Gynecology

## 2014-03-30 ENCOUNTER — Other Ambulatory Visit: Payer: Medicaid Other

## 2014-03-30 ENCOUNTER — Encounter (HOSPITAL_COMMUNITY): Payer: Self-pay

## 2014-03-30 DIAGNOSIS — Z36 Encounter for antenatal screening of mother: Secondary | ICD-10-CM | POA: Diagnosis not present

## 2014-03-30 DIAGNOSIS — Z3A13 13 weeks gestation of pregnancy: Secondary | ICD-10-CM | POA: Diagnosis not present

## 2014-03-30 DIAGNOSIS — Z3491 Encounter for supervision of normal pregnancy, unspecified, first trimester: Secondary | ICD-10-CM

## 2014-03-30 DIAGNOSIS — O09211 Supervision of pregnancy with history of pre-term labor, first trimester: Secondary | ICD-10-CM | POA: Insufficient documentation

## 2014-03-30 DIAGNOSIS — Z3682 Encounter for antenatal screening for nuchal translucency: Secondary | ICD-10-CM

## 2014-03-31 LAB — GLUCOSE TOLERANCE, 1 HOUR (50G) W/O FASTING: GLUCOSE 1 HOUR GTT: 82 mg/dL (ref 70–140)

## 2014-04-06 ENCOUNTER — Other Ambulatory Visit (HOSPITAL_COMMUNITY): Payer: Self-pay

## 2014-04-22 ENCOUNTER — Encounter: Payer: Medicaid Other | Admitting: Obstetrics & Gynecology

## 2014-05-06 ENCOUNTER — Ambulatory Visit (INDEPENDENT_AMBULATORY_CARE_PROVIDER_SITE_OTHER): Payer: Medicaid Other | Admitting: Family Medicine

## 2014-05-06 ENCOUNTER — Inpatient Hospital Stay (HOSPITAL_COMMUNITY)
Admission: AD | Admit: 2014-05-06 | Discharge: 2014-05-06 | Discharge status: Against Medical Advice | Payer: Medicaid Other | Source: Ambulatory Visit | Attending: Obstetrics & Gynecology | Admitting: Obstetrics & Gynecology

## 2014-05-06 VITALS — BP 106/60 | HR 101 | Wt 225.4 lb

## 2014-05-06 DIAGNOSIS — Z3A18 18 weeks gestation of pregnancy: Secondary | ICD-10-CM | POA: Diagnosis not present

## 2014-05-06 DIAGNOSIS — O0992 Supervision of high risk pregnancy, unspecified, second trimester: Secondary | ICD-10-CM

## 2014-05-06 DIAGNOSIS — O0993 Supervision of high risk pregnancy, unspecified, third trimester: Secondary | ICD-10-CM | POA: Insufficient documentation

## 2014-05-06 DIAGNOSIS — B181 Chronic viral hepatitis B without delta-agent: Secondary | ICD-10-CM

## 2014-05-06 DIAGNOSIS — O4702 False labor before 37 completed weeks of gestation, second trimester: Secondary | ICD-10-CM

## 2014-05-06 DIAGNOSIS — O0991 Supervision of high risk pregnancy, unspecified, first trimester: Secondary | ICD-10-CM

## 2014-05-06 LAB — POCT URINALYSIS DIP (DEVICE)
Bilirubin Urine: NEGATIVE
GLUCOSE, UA: NEGATIVE mg/dL
NITRITE: NEGATIVE
PROTEIN: NEGATIVE mg/dL
Specific Gravity, Urine: 1.02 (ref 1.005–1.030)
Urobilinogen, UA: 0.2 mg/dL (ref 0.0–1.0)
pH: 6.5 (ref 5.0–8.0)

## 2014-05-06 LAB — URINALYSIS, ROUTINE W REFLEX MICROSCOPIC
Bilirubin Urine: NEGATIVE
GLUCOSE, UA: NEGATIVE mg/dL
Hgb urine dipstick: NEGATIVE
KETONES UR: NEGATIVE mg/dL
Leukocytes, UA: NEGATIVE
Nitrite: NEGATIVE
PH: 6.5 (ref 5.0–8.0)
Protein, ur: NEGATIVE mg/dL
Specific Gravity, Urine: 1.01 (ref 1.005–1.030)
UROBILINOGEN UA: 0.2 mg/dL (ref 0.0–1.0)

## 2014-05-06 MED ORDER — FLUCONAZOLE 150 MG PO TABS: 150.0000 mg | ORAL_TABLET | Freq: Once | ORAL | Status: DC

## 2014-05-06 NOTE — MAU Note (Signed)
Pt not in lobby.  

## 2014-05-06 NOTE — Progress Notes (Signed)
Patient is 29 y.o. [id number] 9373w3d.  +FM, denies LOF, VB, vaginal discharge.  Overall feeling well. - reports several contractions recently, 5 in 2 hours, very bad yesterday evening.  Feels contractions associated with dizziness, weakness in legs and feeling very weak.  Had 2 contractions today. CE: closed, firm but unable to tell length, suspect long, will order cervical length to confirm given very painful contractions.  No contractions currently.  No tocolysis indicated at this time 2/2 gestational age - Hep B: labs ordered, chronic hepatitis based on serologies in 2013

## 2014-05-06 NOTE — Addendum Note (Signed)
Addended by: Adam PhenixARNOLD, Sunday Klos G on: 05/06/2014 04:46 PM   Modules accepted: Kipp BroodSmartSet

## 2014-05-06 NOTE — Progress Notes (Signed)
Subjective:New OB    Paige Bates is a G5P3013 12.3 weeks being seen today for her first obstetrical visit.  Her obstetrical history is significant for preterm labor but terrm deliveries. Patient does intend to breast feed. Pregnancy history fully reviewed.  Patient reports no complaints.  Filed Vitals:   03/25/14 0908 03/25/14 0909 03/25/14 1009 03/25/14 1025  BP: 104/60     Pulse: 89     Temp: 98.5 F (36.9 C)     Height:  5\' 5"  (1.651 m) 5\' 6"  (1.676 m) 5' 5.75" (1.67 m)  Weight: 224 lb 4.8 oz (101.742 kg)       HISTORY: OB History  Gravida Para Term Preterm AB SAB TAB Ectopic Multiple Living  5 3 3  0 1 0 1 0 0 3    # Outcome Date GA Lbr Len/2nd Weight Sex Delivery Anes PTL Lv  5 Current           4 Term 06/22/12 8365w6d 12:40 / 00:11 9 lb 3.8 oz (4.19 kg) M Vag-Spont EPI  Y  3 Term 09/30/08 4963w0d  7 lb 4 oz (3.289 kg) F Vag-Spont None Y Y     Comments: PTL at 7mos, admitted on magnesium. delivered at term.   2 Term 09/02/07 4263w0d  7 lb (3.175 kg) F Vag-Spont EPI N Y  1 TAB 2004             Past Medical History  Diagnosis Date  . Depression     no meds  . Anemia   . Preterm labor     PTL with 2nd child  . Chronic hepatitis B   . Eczema    Past Surgical History  Procedure Laterality Date  . No past surgeries    . Tooth extraction     Family History  Problem Relation Age of Onset  . Other Neg Hx      Exam    Uterus:     Pelvic Exam:    Perineum: No Hemorrhoids   Vulva: normal   Vagina:  normal mucosa   pH:     Cervix: no lesions   Adnexa: normal adnexa   Bony Pelvis: average  System: Breast:  normal appearance, no masses or tenderness   Skin: normal coloration and turgor, no rashes    Neurologic: oriented, normal mood   Extremities: normal strength, tone, and muscle mass   HEENT PERRLA   Mouth/Teeth dental hygiene good   Neck supple   Cardiovascular: regular rate and rhythm, no murmurs or gallops   Respiratory:  appears well, vitals normal,  no respiratory distress, acyanotic, normal RR   Abdomen: soft, non-tender; bowel sounds normal; no masses,  no organomegaly   Urinary: urethral meatus normal      Assessment:    Pregnancy: [id number] Patient Active Problem List   Diagnosis Date Noted  . Supervision of high risk pregnancy in second trimester 05/06/2014  . [redacted] weeks gestation of pregnancy   . Encounter for (NT) nuchal translucency scan   . GERD (gastroesophageal reflux disease) 04/24/2012  . Supervision of high-risk pregnancy 12/27/2011  . HEPATITIS B 12/15/2008  . OBESITY 12/15/2008        Plan:     Initial labs drawn. Prenatal vitamins. Problem list reviewed and updated. Genetic Screening discussed First Screen: offered.  Ultrasound discussed; fetal survey: 18 weeks.  Follow up in 4 weeks. 50% of 30 min visit spent on counseling and coordination of care.     Paige Bates 05/06/2014

## 2014-05-06 NOTE — Progress Notes (Signed)
Pt feels dizzy and weak.  Thinks that she may have a yeast infection. She experiences itching two days after intercourse.

## 2014-05-06 NOTE — MAU Note (Signed)
Pt reports for the last 2 days she has been having contractions and dizziness. Was seen at the clinic today, reported the dizziness and contractions. States her cervix was long and closed.

## 2014-05-07 LAB — HEPATITIS B SURFACE ANTIBODY,QUALITATIVE: Hep B S Ab: NEGATIVE

## 2014-05-07 LAB — HEPATITIS B CORE ANTIBODY, TOTAL: Hep B Core Total Ab: REACTIVE — AB

## 2014-05-07 LAB — HEPATITIS B CORE ANTIBODY, IGM: Hep B C IgM: NONREACTIVE

## 2014-05-07 LAB — HEPATITIS B SURFACE ANTIGEN: Hepatitis B Surface Ag: POSITIVE — AB

## 2014-05-07 LAB — HEPATITIS B SURF AG CONFIRMATION: Hepatitis B Surf Ag Confirmation: POSITIVE — AB

## 2014-05-10 LAB — HEPATITIS B E ANTIGEN: Hepatitis Be Antigen: NONREACTIVE

## 2014-05-10 LAB — HEPATITIS B E ANTIBODY: Hepatitis Be Antibody: REACTIVE — AB

## 2014-05-11 LAB — HEPATITIS B DNA, ULTRAQUANTITATIVE, PCR
Hepatitis B DNA (Calc): 6140 copies/mL — ABNORMAL HIGH (ref <116)
Hepatitis B DNA: 1055 IU/mL — ABNORMAL HIGH (ref <20)

## 2014-05-13 ENCOUNTER — Other Ambulatory Visit: Payer: Self-pay

## 2014-05-13 ENCOUNTER — Ambulatory Visit (HOSPITAL_COMMUNITY)
Admission: RE | Admit: 2014-05-13 | Discharge: 2014-05-13 | Disposition: A | Discharge status: Home / Self Care | Payer: Medicaid Other | Source: Ambulatory Visit | Attending: Family Medicine | Admitting: Family Medicine

## 2014-05-13 ENCOUNTER — Other Ambulatory Visit: Payer: Self-pay | Admitting: Family Medicine

## 2014-05-13 DIAGNOSIS — O4700 False labor before 37 completed weeks of gestation, unspecified trimester: Secondary | ICD-10-CM | POA: Insufficient documentation

## 2014-05-13 DIAGNOSIS — Z36 Encounter for antenatal screening of mother: Secondary | ICD-10-CM | POA: Insufficient documentation

## 2014-05-13 DIAGNOSIS — O09212 Supervision of pregnancy with history of pre-term labor, second trimester: Secondary | ICD-10-CM | POA: Insufficient documentation

## 2014-05-13 DIAGNOSIS — Z3689 Encounter for other specified antenatal screening: Secondary | ICD-10-CM

## 2014-05-13 DIAGNOSIS — Z3A19 19 weeks gestation of pregnancy: Secondary | ICD-10-CM | POA: Insufficient documentation

## 2014-05-13 DIAGNOSIS — O0992 Supervision of high risk pregnancy, unspecified, second trimester: Secondary | ICD-10-CM

## 2014-05-13 DIAGNOSIS — O4702 False labor before 37 completed weeks of gestation, second trimester: Secondary | ICD-10-CM

## 2014-05-13 DIAGNOSIS — O479 False labor, unspecified: Secondary | ICD-10-CM | POA: Insufficient documentation

## 2014-05-13 DIAGNOSIS — O99212 Obesity complicating pregnancy, second trimester: Secondary | ICD-10-CM | POA: Diagnosis not present

## 2014-05-13 NOTE — Progress Notes (Signed)
Pt came to front desk and informed me that she wanted to her blood work results.  I informed her that her results were normal.  Pt then asked "why do I keep getting dizzy".  Pt also stated that her BP was "110's and then she checked it again it was 96 over something" at work one day she checked.  I asked pt if she is eating six, small, frequent meals a day, well- hydrated especially in this temperatures that we have had.  Pt stated that she does stay hydrated and she eats small frequent meals.  Pt BP today 119/67 asymptomatic.  I advised pt to notice what she is doing when she becomes dizzy and to monitor her BP.  I also stated to pt if she has any questions, to please give our office a call.  Pt agreed.

## 2014-06-03 ENCOUNTER — Ambulatory Visit (INDEPENDENT_AMBULATORY_CARE_PROVIDER_SITE_OTHER): Payer: Medicaid Other | Admitting: Obstetrics & Gynecology

## 2014-06-03 VITALS — BP 112/60 | HR 106 | Temp 98.4°F | Wt 229.3 lb

## 2014-06-03 DIAGNOSIS — O0992 Supervision of high risk pregnancy, unspecified, second trimester: Secondary | ICD-10-CM

## 2014-06-03 LAB — POCT URINALYSIS DIP (DEVICE)
BILIRUBIN URINE: NEGATIVE
GLUCOSE, UA: NEGATIVE mg/dL
Hgb urine dipstick: NEGATIVE
Ketones, ur: NEGATIVE mg/dL
Nitrite: NEGATIVE
Protein, ur: NEGATIVE mg/dL
SPECIFIC GRAVITY, URINE: 1.02 (ref 1.005–1.030)
UROBILINOGEN UA: 0.2 mg/dL (ref 0.0–1.0)
pH: 7 (ref 5.0–8.0)

## 2014-06-03 NOTE — Progress Notes (Signed)
Patient reports increase in contractions that are very painful for several weeks now. States she is contracting a lot, up to 8 contractions in an hour

## 2014-06-03 NOTE — Progress Notes (Signed)
Burning sensation abdomen and some contractions, needs f/u US anatomy next week

## 2014-06-03 NOTE — Progress Notes (Signed)
Trace leuks in urine.  

## 2014-06-03 NOTE — Patient Instructions (Signed)
Second Trimester of Pregnancy The second trimester is from week 13 through week 28, months 4 through 6. The second trimester is often a time when you feel your best. Your body has also adjusted to being pregnant, and you begin to feel better physically. Usually, morning sickness has lessened or quit completely, you may have more energy, and you may have an increase in appetite. The second trimester is also a time when the fetus is growing rapidly. At the end of the sixth month, the fetus is about 9 inches long and weighs about 1 pounds. You will likely begin to feel the baby move (quickening) between 18 and 20 weeks of the pregnancy. BODY CHANGES Your body goes through many changes during pregnancy. The changes vary from woman to woman.   Your weight will continue to increase. You will notice your lower abdomen bulging out.  You may begin to get stretch marks on your hips, abdomen, and breasts.  You may develop headaches that can be relieved by medicines approved by your health care provider.  You may urinate more often because the fetus is pressing on your bladder.  You may develop or continue to have heartburn as a result of your pregnancy.  You may develop constipation because certain hormones are causing the muscles that push waste through your intestines to slow down.  You may develop hemorrhoids or swollen, bulging veins (varicose veins).  You may have back pain because of the weight gain and pregnancy hormones relaxing your joints between the bones in your pelvis and as a result of a shift in weight and the muscles that support your balance.  Your breasts will continue to grow and be tender.  Your gums may bleed and may be sensitive to brushing and flossing.  Dark spots or blotches (chloasma, mask of pregnancy) may develop on your face. This will likely fade after the baby is born.  A dark line from your belly button to the pubic area (linea nigra) may appear. This will likely fade  after the baby is born.  You may have changes in your hair. These can include thickening of your hair, rapid growth, and changes in texture. Some women also have hair loss during or after pregnancy, or hair that feels dry or thin. Your hair will most likely return to normal after your baby is born. WHAT TO EXPECT AT YOUR PRENATAL VISITS During a routine prenatal visit:  You will be weighed to make sure you and the fetus are growing normally.  Your blood pressure will be taken.  Your abdomen will be measured to track your baby's growth.  The fetal heartbeat will be listened to.  Any test results from the previous visit will be discussed. Your health care provider may ask you:  How you are feeling.  If you are feeling the baby move.  If you have had any abnormal symptoms, such as leaking fluid, bleeding, severe headaches, or abdominal cramping.  If you have any questions. Other tests that may be performed during your second trimester include:  Blood tests that check for:  Low iron levels (anemia).  Gestational diabetes (between 24 and 28 weeks).  Rh antibodies.  Urine tests to check for infections, diabetes, or protein in the urine.  An ultrasound to confirm the proper growth and development of the baby.  An amniocentesis to check for possible genetic problems.  Fetal screens for spina bifida and Down syndrome. HOME CARE INSTRUCTIONS   Avoid all smoking, herbs, alcohol, and unprescribed   drugs. These chemicals affect the formation and growth of the baby.  Follow your health care provider's instructions regarding medicine use. There are medicines that are either safe or unsafe to take during pregnancy.  Exercise only as directed by your health care provider. Experiencing uterine cramps is a good sign to stop exercising.  Continue to eat regular, healthy meals.  Wear a good support bra for breast tenderness.  Do not use hot tubs, steam rooms, or saunas.  Wear your  seat belt at all times when driving.  Avoid raw meat, uncooked cheese, cat litter boxes, and soil used by cats. These carry germs that can cause birth defects in the baby.  Take your prenatal vitamins.  Try taking a stool softener (if your health care provider approves) if you develop constipation. Eat more high-fiber foods, such as fresh vegetables or fruit and whole grains. Drink plenty of fluids to keep your urine clear or pale yellow.  Take warm sitz baths to soothe any pain or discomfort caused by hemorrhoids. Use hemorrhoid cream if your health care provider approves.  If you develop varicose veins, wear support hose. Elevate your feet for 15 minutes, 3-4 times a day. Limit salt in your diet.  Avoid heavy lifting, wear low heel shoes, and practice good posture.  Rest with your legs elevated if you have leg cramps or low back pain.  Visit your dentist if you have not gone yet during your pregnancy. Use a soft toothbrush to brush your teeth and be gentle when you floss.  A sexual relationship may be continued unless your health care provider directs you otherwise.  Continue to go to all your prenatal visits as directed by your health care provider. SEEK MEDICAL CARE IF:   You have dizziness.  You have mild pelvic cramps, pelvic pressure, or nagging pain in the abdominal area.  You have persistent nausea, vomiting, or diarrhea.  You have a bad smelling vaginal discharge.  You have pain with urination. SEEK IMMEDIATE MEDICAL CARE IF:   You have a fever.  You are leaking fluid from your vagina.  You have spotting or bleeding from your vagina.  You have severe abdominal cramping or pain.  You have rapid weight gain or loss.  You have shortness of breath with chest pain.  You notice sudden or extreme swelling of your face, hands, ankles, feet, or legs.  You have not felt your baby move in over an hour.  You have severe headaches that do not go away with  medicine.  You have vision changes. Document Released: 02/06/2001 Document Revised: 02/17/2013 Document Reviewed: 04/15/2012 Exit[employer] Patient Information 2015 [employer]. This information is not intended to replace advice given to you by your health care provider. Make sure you discuss any questions you have with your health care provider.  

## 2014-06-03 NOTE — Progress Notes (Signed)
Follow up U/S 06/10/14 @ 1045a with Radiology.

## 2014-06-05 LAB — CULTURE, OB URINE
Colony Count: NO GROWTH
ORGANISM ID, BACTERIA: NO GROWTH

## 2014-06-10 ENCOUNTER — Encounter (HOSPITAL_COMMUNITY): Payer: Self-pay | Admitting: *Deleted

## 2014-06-10 ENCOUNTER — Ambulatory Visit (HOSPITAL_COMMUNITY)
Admission: RE | Admit: 2014-06-10 | Discharge: 2014-06-10 | Disposition: A | Discharge status: Home / Self Care | Payer: Medicaid Other | Source: Ambulatory Visit | Attending: Obstetrics & Gynecology | Admitting: Obstetrics & Gynecology

## 2014-06-10 ENCOUNTER — Inpatient Hospital Stay (HOSPITAL_COMMUNITY)
Admission: AD | Admit: 2014-06-10 | Discharge: 2014-06-10 | Disposition: A | Discharge status: Home / Self Care | Payer: Medicaid Other | Source: Ambulatory Visit | Attending: Obstetrics & Gynecology | Admitting: Obstetrics & Gynecology

## 2014-06-10 DIAGNOSIS — Z3A34 34 weeks gestation of pregnancy: Secondary | ICD-10-CM

## 2014-06-10 DIAGNOSIS — O0992 Supervision of high risk pregnancy, unspecified, second trimester: Secondary | ICD-10-CM | POA: Diagnosis present

## 2014-06-10 DIAGNOSIS — O4702 False labor before 37 completed weeks of gestation, second trimester: Secondary | ICD-10-CM | POA: Diagnosis not present

## 2014-06-10 DIAGNOSIS — B9689 Other specified bacterial agents as the cause of diseases classified elsewhere: Secondary | ICD-10-CM | POA: Insufficient documentation

## 2014-06-10 DIAGNOSIS — Z0489 Encounter for examination and observation for other specified reasons: Secondary | ICD-10-CM | POA: Insufficient documentation

## 2014-06-10 DIAGNOSIS — R51 Headache: Secondary | ICD-10-CM | POA: Diagnosis not present

## 2014-06-10 DIAGNOSIS — N390 Urinary tract infection, site not specified: Secondary | ICD-10-CM | POA: Insufficient documentation

## 2014-06-10 DIAGNOSIS — Z3A23 23 weeks gestation of pregnancy: Secondary | ICD-10-CM | POA: Insufficient documentation

## 2014-06-10 DIAGNOSIS — O4452 Low lying placenta with hemorrhage, second trimester: Secondary | ICD-10-CM | POA: Insufficient documentation

## 2014-06-10 DIAGNOSIS — IMO0002 Reserved for concepts with insufficient information to code with codable children: Secondary | ICD-10-CM | POA: Insufficient documentation

## 2014-06-10 DIAGNOSIS — O2342 Unspecified infection of urinary tract in pregnancy, second trimester: Secondary | ICD-10-CM

## 2014-06-10 HISTORY — DX: Headache: R51

## 2014-06-10 HISTORY — DX: Headache, unspecified: R51.9

## 2014-06-10 LAB — URINALYSIS, ROUTINE W REFLEX MICROSCOPIC
Bilirubin Urine: NEGATIVE
GLUCOSE, UA: NEGATIVE mg/dL
Ketones, ur: NEGATIVE mg/dL
Nitrite: NEGATIVE
PH: 6.5 (ref 5.0–8.0)
Protein, ur: NEGATIVE mg/dL
Specific Gravity, Urine: 1.01 (ref 1.005–1.030)
Urobilinogen, UA: 0.2 mg/dL (ref 0.0–1.0)

## 2014-06-10 LAB — URINE MICROSCOPIC-ADD ON

## 2014-06-10 LAB — WET PREP, GENITAL
CLUE CELLS WET PREP: NONE SEEN
TRICH WET PREP: NONE SEEN
Yeast Wet Prep HPF POC: NONE SEEN

## 2014-06-10 MED ORDER — IBUPROFEN 600 MG PO TABS
600.0000 mg | ORAL_TABLET | Freq: Once | ORAL | Status: AC
  Administered 2014-06-10: 600 mg via ORAL
  Filled 2014-06-10: qty 1

## 2014-06-10 MED ORDER — NIFEDIPINE 10 MG PO CAPS
10.0000 mg | ORAL_CAPSULE | ORAL | Status: DC | PRN
  Administered 2014-06-10: 10 mg via ORAL
  Filled 2014-06-10: qty 1

## 2014-06-10 MED ORDER — NITROFURANTOIN MONOHYD MACRO 100 MG PO CAPS: 100.0000 mg | ORAL_CAPSULE | Freq: Two times a day (BID) | ORAL | Status: DC

## 2014-06-10 MED ORDER — IBUPROFEN 400 MG PO TABS: 400.0000 mg | ORAL_TABLET | ORAL | Status: DC | PRN

## 2014-06-10 NOTE — MAU Note (Signed)
No longer feeling contractions, medication for HA given.  Explained to pt that US was completed.

## 2014-06-10 NOTE — MAU Provider Note (Signed)
History     CSN: [id number]  Arrival date and time: 06/10/14 1137   First Provider Initiated Contact with Patient 06/10/14 1204      Chief Complaint  Patient presents with  . Contractions   HPI Patient is 29 y.o. [id number] here after being seen for ultrasound.  Patient reports that she was getting her ultrasound and that it was too uncomfortable to undergo.  She reports having had contractions for a couple of months.  She states that she doesn't come in for them because she usually is doing ok.  She endorses intermittent vomiting.  Denies diarrhea, constipation, fevers, inability to tolerate PO.  She feels that she stays well hydrated.  Endorses urinary frequency but denies hematuria/dysuria.  +FM, denies LOF, VB, vaginal discharge.   OB History    Gravida Para Term Preterm AB TAB SAB Ectopic Multiple Living   5 3 3  0 1 1 0 0 0 3      Past Medical History  Diagnosis Date  . Anemia   . Preterm labor     PTL with 2nd child  . Chronic hepatitis B   . Eczema   . Headache   . Depression     no meds; PP with 2nd prg    Past Surgical History  Procedure Laterality Date  . No past surgeries    . Tooth extraction      Family History  Problem Relation Age of Onset  . Other Neg Hx   . Asthma Neg Hx   . Cancer Neg Hx   . Diabetes Neg Hx   . Hearing loss Neg Hx   . Heart disease Neg Hx   . Hypertension Neg Hx   . Stroke Neg Hx     History  Substance Use Topics  . Smoking status: Never Smoker   . Smokeless tobacco: Never Used  . Alcohol Use: No    Allergies:  Allergies  Allergen Reactions  . Latex Itching and Rash    Prescriptions prior to admission  Medication Sig Dispense Refill Last Dose  . Prenatal Vit-Fe Fumarate-FA (PRENATAL VITAMIN) 27-0.8 MG TABS Take 1 tablet by mouth daily. 30 tablet 11 06/09/2014 at Unknown time  . Doxylamine-Pyridoxine 10-10 MG TBEC 2 PO qhs; may take 1po in am and 1po in afternoon prn nausea (Patient not taking: Reported on  06/10/2014) 120 tablet 3 Taking    Review of Systems  Constitutional: Negative for fever and chills.  Gastrointestinal: Positive for abdominal pain. Negative for nausea, vomiting, diarrhea and constipation.  Genitourinary: Positive for frequency. Negative for dysuria, urgency and hematuria.       Neg for VB or LOF. Pos FM.   Musculoskeletal: Positive for back pain.   Physical Exam   Blood pressure 114/60, pulse 97, temperature 98.1 F (36.7 C), temperature source Oral, resp. rate 18, weight 234 lb 2 oz (106.198 kg), last menstrual period 12/28/2013, SpO2 98 %, currently breastfeeding.  Physical Exam  Constitutional: She is oriented to person, place, and time. She appears well-developed and well-nourished. No distress.  HENT:  Head: Normocephalic and atraumatic.  Eyes: EOM are normal. No scleral icterus.  Neck: Normal range of motion. Neck supple.  Cardiovascular: Normal rate, regular rhythm, normal heart sounds and intact distal pulses.   No murmur heard. Respiratory: Effort normal and breath sounds normal. No respiratory distress. She has no wheezes.  GI: Bowel sounds are normal. There is no tenderness. There is no rebound.  gravid  Genitourinary: Vagina normal. There  is no lesion on the right labia. There is no lesion on the left labia. Uterus is not tender. Cervix exhibits no discharge and no friability. Right adnexum displays no tenderness. Left adnexum displays no tenderness. No erythema or bleeding in the vagina. No vaginal discharge found.  Musculoskeletal: Normal range of motion. She exhibits no edema or tenderness.  Neurological: She is alert and oriented to person, place, and time.  Skin: Skin is warm and dry. No rash noted.  Psychiatric: She has a normal mood and affect. Her behavior is normal.   Dilation: Closed Effacement (%):  (? short) Cervical Position: Posterior Presentation: Undeterminable Exam by:: V Evalyse Stroope  Fetal monitoringBaseline: 145 bpm, Variability: Good  {> 6 bpm) and Accelerations: Reassuring for gest age.  Uterine activity Frequency: irregular, with uterine irritation  Results for orders placed or performed during the hospital encounter of 06/10/14 (from the past 24 hour(s))  Urinalysis, Routine w reflex microscopic     Status: Abnormal   Collection Time: 06/10/14 11:40 AM  Result Value Ref Range   Color, Urine YELLOW YELLOW   APPearance CLEAR CLEAR   Specific Gravity, Urine 1.010 1.005 - 1.030   pH 6.5 5.0 - 8.0   Glucose, UA NEGATIVE NEGATIVE mg/dL   Hgb urine dipstick TRACE (A) NEGATIVE   Bilirubin Urine NEGATIVE NEGATIVE   Ketones, ur NEGATIVE NEGATIVE mg/dL   Protein, ur NEGATIVE NEGATIVE mg/dL   Urobilinogen, UA 0.2 0.0 - 1.0 mg/dL   Nitrite NEGATIVE NEGATIVE   Leukocytes, UA MODERATE (A) NEGATIVE  Urine microscopic-add on     Status: Abnormal   Collection Time: 06/10/14 11:40 AM  Result Value Ref Range   Squamous Epithelial / LPF RARE RARE   WBC, UA 7-10 <3 WBC/hpf   Bacteria, UA FEW (A) RARE  Wet prep, genital     Status: Abnormal   Collection Time: 06/10/14 12:10 PM  Result Value Ref Range   Yeast Wet Prep HPF POC NONE SEEN NONE SEEN   Trich, Wet Prep NONE SEEN NONE SEEN   Clue Cells Wet Prep HPF POC NONE SEEN NONE SEEN   WBC, Wet Prep HPF POC MODERATE (A) NONE SEEN    MAU Course  Procedures  MDM NST Reassuring for gest age.  UA, wet prep Procardia x1  Assessment and Plan  Asymptomatic bacteruria in pregnancy/UTI -Macrobid 100 BID x7 days, ucx pending  Preterm Contractions.  Well relieved by 1 dose of Procardia . -PO hydration -Reassurance  Headache likely 2/2 to Procardia -Motrin 400 q6 x48 hours  Return precautions discussed with patient.  Instructed to follow up with OB clinician as scheduled.  Delynn Flavin M, DO 06/10/2014, 1:29 PM   I was present for the exam and agree with above.  [place]yatt, CNM 06/10/2014 2:06 PM

## 2014-06-10 NOTE — MAU Note (Signed)
Pain in lower abd and low back started last night. No bleeding or leaking.  Denies GU problems, ongoing vomiting.

## 2014-06-10 NOTE — MAU Note (Signed)
Pt was having U/S done today and was uncomfortable to ld u/s tech she was having ctx. Pt stated now she has been having ctx off and on for several weeks and they are getting worse.. Denies SROM or vag bleeding. Good fetal movement reported.

## 2014-06-10 NOTE — MAU Note (Signed)
States no longer feeling contractions, but feels like heart is beating faster and she has a headache now.  Explained how medicine worked and the typical side effects. (HA not unusual, pulse and BP lower)

## 2014-06-10 NOTE — Discharge Instructions (Signed)
Abdominal Pain During Pregnancy °Belly (abdominal) pain is common during pregnancy. Most of the time, it is not a serious problem. Other times, it can be a sign that something is wrong with the pregnancy. Always tell your doctor if you have belly pain. °HOME CARE °Monitor your belly pain for any changes. The following actions may help you feel better: °· Do not have sex (intercourse) or put anything in your vagina until you feel better. °· Rest until your pain stops. °· Drink clear fluids if you feel sick to your stomach (nauseous). Do not eat solid food until you feel better. °· Only take medicine as told by your doctor. °· Keep all doctor visits as told. °GET HELP RIGHT AWAY IF:  °· You are bleeding, leaking fluid, or pieces of tissue come out of your vagina. °· You have more pain or cramping. °· You keep throwing up (vomiting). °· You have pain when you pee (urinate) or have blood in your pee. °· You have a fever. °· You do not feel your baby moving as much. °· You feel very weak or feel like passing out. °· You have trouble breathing, with or without belly pain. °· You have a very bad headache and belly pain. °· You have fluid leaking from your vagina and belly pain. °· You keep having watery poop (diarrhea). °· Your belly pain does not go away after resting, or the pain gets worse. °MAKE SURE YOU:  °· Understand these instructions. °· Will watch your condition. °· Will get help right away if you are not doing well or get worse. °Document Released: 01/31/2009 Document Revised: 10/15/2012 Document Reviewed: 09/11/2012 °ExitCare® Patient Information ©2015 [employer]. This information is not intended to replace advice given to you by your health care provider. Make sure you discuss any questions you have with your health care provider. °Braxton Hicks Contractions °Contractions of the uterus can occur throughout pregnancy. Contractions are not always a sign that you are in labor.  °WHAT ARE BRAXTON HICKS  CONTRACTIONS?  °Contractions that occur before labor are called Braxton Hicks contractions, or false labor. Toward the end of pregnancy (32-34 weeks), these contractions can develop more often and may become more forceful. This is not true labor because these contractions do not result in opening (dilatation) and thinning of the cervix. They are sometimes difficult to tell apart from true labor because these contractions can be forceful and people have different pain tolerances. You should not feel embarrassed if you go to the hospital with false labor. Sometimes, the only way to tell if you are in true labor is for your health care provider to look for changes in the cervix. °If there are no prenatal problems or other health problems associated with the pregnancy, it is completely safe to be sent home with false labor and await the onset of true labor. °HOW CAN YOU TELL THE DIFFERENCE BETWEEN TRUE AND FALSE LABOR? °False Labor °· The contractions of false labor are usually shorter and not as hard as those of true labor.   °· The contractions are usually irregular.   °· The contractions are often felt in the front of the lower abdomen and in the groin.   °· The contractions may go away when you walk around or change positions while lying down.   °· The contractions get weaker and are shorter lasting as time goes on.   °· The contractions do not usually become progressively stronger, regular, and closer together as with true labor.   °True Labor °·   Contractions in true labor last 30-70 seconds, become very regular, usually become more intense, and increase in frequency.   °· The contractions do not go away with walking.   °· The discomfort is usually felt in the top of the uterus and spreads to the lower abdomen and low back.   °· True labor can be determined by your health care provider with an exam. This will show that the cervix is dilating and getting thinner.   °WHAT TO REMEMBER °· Keep up with your usual  exercises and follow other instructions given by your health care provider.   °· Take medicines as directed by your health care provider.   °· Keep your regular prenatal appointments.   °· Eat and drink lightly if you think you are going into labor.   °· If Braxton Hicks contractions are making you uncomfortable:   °¨ Change your position from lying down or resting to walking, or from walking to resting.   °¨ Sit and rest in a tub of warm water.   °¨ Drink 2-3 glasses of water. Dehydration may cause these contractions.   °¨ Do slow and deep breathing several times an hour.   °WHEN SHOULD I SEEK IMMEDIATE MEDICAL CARE? °Seek immediate medical care if: °· Your contractions become stronger, more regular, and closer together.   °· You have fluid leaking or gushing from your vagina.   °· You have a fever.   °· You pass blood-tinged mucus.   °· You have vaginal bleeding.   °· You have continuous abdominal pain.   °· You have low back pain that you never had before.   °· You feel your baby's head pushing down and causing pelvic pressure.   °· Your baby is not moving as much as it used to.   °Document Released: 02/12/2005 Document Revised: 02/17/2013 Document Reviewed: 11/24/2012 °ExitCare® Patient Information ©2015 [employer]. This information is not intended to replace advice given to you by your health care provider. Make sure you discuss any questions you have with your health care provider. ° °

## 2014-06-10 NOTE — MAU Note (Signed)
States has been contracting for 2 months. Cervix has been closed  When checked

## 2014-06-11 LAB — CULTURE, OB URINE
COLONY COUNT: NO GROWTH
CULTURE: NO GROWTH
SPECIAL REQUESTS: NORMAL

## 2014-06-11 LAB — GC/CHLAMYDIA PROBE AMP (~~LOC~~) NOT AT ARMC
Chlamydia: NEGATIVE
NEISSERIA GONORRHEA: NEGATIVE

## 2014-06-22 ENCOUNTER — Inpatient Hospital Stay (HOSPITAL_COMMUNITY)
Admission: AD | Admit: 2014-06-22 | Discharge: 2014-06-22 | Disposition: A | Discharge status: Home / Self Care | Payer: Medicaid Other | Source: Ambulatory Visit | Attending: Family Medicine | Admitting: Family Medicine

## 2014-06-22 ENCOUNTER — Encounter (HOSPITAL_COMMUNITY): Payer: Self-pay | Admitting: *Deleted

## 2014-06-22 DIAGNOSIS — O4703 False labor before 37 completed weeks of gestation, third trimester: Secondary | ICD-10-CM

## 2014-06-22 DIAGNOSIS — Z3A25 25 weeks gestation of pregnancy: Secondary | ICD-10-CM | POA: Diagnosis not present

## 2014-06-22 DIAGNOSIS — O4702 False labor before 37 completed weeks of gestation, second trimester: Secondary | ICD-10-CM | POA: Insufficient documentation

## 2014-06-22 DIAGNOSIS — O479 False labor, unspecified: Secondary | ICD-10-CM

## 2014-06-22 LAB — URINE MICROSCOPIC-ADD ON

## 2014-06-22 LAB — URINALYSIS, ROUTINE W REFLEX MICROSCOPIC
Bilirubin Urine: NEGATIVE
GLUCOSE, UA: NEGATIVE mg/dL
Ketones, ur: NEGATIVE mg/dL
Leukocytes, UA: NEGATIVE
Nitrite: NEGATIVE
PH: 6.5 (ref 5.0–8.0)
Protein, ur: NEGATIVE mg/dL
SPECIFIC GRAVITY, URINE: 1.015 (ref 1.005–1.030)
UROBILINOGEN UA: 0.2 mg/dL (ref 0.0–1.0)

## 2014-06-22 MED ORDER — NIFEDIPINE 10 MG PO CAPS: 10.0000 mg | ORAL_CAPSULE | ORAL | Status: DC | PRN

## 2014-06-22 NOTE — Progress Notes (Signed)
Pt states pain is a 10 when it comes. Pt states the last time she felt a pain is when she was waiting to come in.

## 2014-06-22 NOTE — MAU Provider Note (Signed)
History     CSN: [id number]  Arrival date and time: 06/22/14 1512   First Provider Initiated Contact with Patient 06/22/14 1626      Chief Complaint  Patient presents with  . Contractions   HPI   Paige Bates is a 29 y.o. [id number], currently [redacted]w[redacted]d, presenting to [facility] for evaluation of increased contractions. [facility] and informed them she had been having increased contractions over the past night, and they recommended she be evaluated.   Patient reports she has been having contractions for over 2 months, and that they vary in intensity. When the pain is at its worse and mostly located around her groin and lower back bilaterally, she "crawls around her house" to tend to her other children. She reports the contractions can sometimes occur once an hour for some days, then she may not have them for 24+ hours. She reports having this happen in her previous pregnancies and was hospitalized 5 years ago to receive Magnesium but was then released and went to term with the pregnancy. She denies any loss of fluids, discharge, or vaginal bleeding. She does report having headaches, but says they have not increased in severity or frequency and are relieved with Motrin. - +fetal movement, no LOF, no VB    OB History    Gravida Para Term Preterm AB TAB SAB Ectopic Multiple Living   0 1 1 0 0 0 3      Past Medical History  Diagnosis Date  . Anemia   . Preterm labor     PTL with 2nd child  . Chronic hepatitis B   . Eczema   . Headache   . Depression     no meds; PP with 2nd prg    Past Surgical History  Procedure Laterality Date  . No past surgeries    . Tooth extraction      Family History  Problem Relation Age of Onset  . Other Neg Hx   . Asthma Neg Hx   . Cancer Neg Hx   . Diabetes Neg Hx   . Hearing loss Neg Hx   . Heart disease Neg Hx   . Hypertension Neg Hx   . Stroke Neg Hx     History   Substance Use Topics  . Smoking status: Never Smoker   . Smokeless tobacco: Never Used  . Alcohol Use: No    Allergies:  Allergies  Allergen Reactions  . Latex Itching and Rash    Prescriptions prior to admission  Medication Sig Dispense Refill Last Dose  . ibuprofen (ADVIL,MOTRIN) 400 MG tablet Take 1 tablet (400 mg total) by mouth every 4 (four) hours as needed for headache or moderate pain. 12 tablet 0 Past Week at Unknown time  . nitrofurantoin, macrocrystal-monohydrate, (MACROBID) 100 MG capsule Take 1 capsule (100 mg total) by mouth 2 (two) times daily. x7 days 14 capsule 0 06/21/2014 at Unknown time  . Prenatal Vit-Fe Fumarate-FA (PRENATAL VITAMIN) 27-0.8 MG TABS Take 1 tablet by mouth daily. 30 tablet 11 06/21/2014 at Unknown time  . Doxylamine-Pyridoxine 10-10 MG TBEC 2 PO qhs; may take 1po in am and 1po in afternoon prn nausea (Patient not taking: Reported on 06/10/2014) 120 tablet 3 Taking    Review of Systems  Constitutional: Negative for fever and chills.  HENT: Negative for congestion and sore throat.   Respiratory: Negative for cough, sputum production, shortness of breath and wheezing.  Cardiovascular: Positive for palpitations. Negative for chest pain and leg swelling.  Gastrointestinal: Positive for nausea and abdominal pain (Groin). Negative for diarrhea, constipation and blood in stool.  Genitourinary: Negative for dysuria, frequency, hematuria and flank pain.  Musculoskeletal: Positive for back pain.  Neurological: Positive for headaches. Negative for dizziness, seizures and loss of consciousness.   Physical Exam   Blood pressure 127/65, pulse 95, resp. rate 18, height 5\' 6"  (1.676 m), weight 106.142 kg (234 lb), last menstrual period 12/28/2013, SpO2 100 %, currently breastfeeding.  Physical Exam  Nursing note and vitals reviewed. Constitutional: She is oriented to person, place, and time. She appears well-developed and well-nourished. No distress.  HENT:   Head: Normocephalic and atraumatic.  Eyes: Pupils are equal, round, and reactive to light. Right eye exhibits no discharge. Left eye exhibits no discharge.  Neck: Normal range of motion. No thyromegaly present.  Cardiovascular: Normal rate, regular rhythm and intact distal pulses.  Exam reveals no gallop and no friction rub.   No murmur heard. Pulses:      Posterior tibial pulses are 2+ on the right side, and 2+ on the left side.  Respiratory: Effort normal and breath sounds normal. No respiratory distress. She has no wheezes. She has no rales. She exhibits no tenderness.  GI: Soft. Bowel sounds are normal. She exhibits no distension and no mass. There is no tenderness. There is no rebound and no guarding.  Gravid  Genitourinary:  Cervix full length at 4cm and thick. Dilation to fingertip.  Musculoskeletal: She exhibits no edema or tenderness.  Lymphadenopathy:    She has no cervical adenopathy.  Neurological: She is alert and oriented to person, place, and time.  Skin: Skin is warm and dry. No rash noted. She is not diaphoretic. No erythema. No pallor.    MAU Course  Procedures: None   MDM: Fetal heart rate at baseline of 150's.    Assessment and Plan  A:  Braxton Hick's contraction  P:  Prescription given for Procardia. Patient instructed to to take the medication when she experiences frequent contractions.  May take 10mg  prn q7210minutes, present to MAU if no improvement after 2-3 doses.  Pt agreeable with plan.  Did not receive procardia while in MAU, on rx'd procardia as she reports when they occur at night they can be debilitating.  Return precautions given to patient.  Education provided concerning Braxton Hick's contractions.    Paige Bates,Paige Macchia ROCIO, MD 06/22/2014, 4:29 PM

## 2014-06-22 NOTE — MAU Note (Signed)
Pt states she has been having pain for 2 months. Pt states she usually goes to clinic and gets her cervix checked. [facility] Pt was referred to MAU for evaluation.

## 2014-06-22 NOTE — Discharge Instructions (Signed)
Braxton Hicks Contractions °Contractions of the uterus can occur throughout pregnancy. Contractions are not always a sign that you are in labor.  °WHAT ARE BRAXTON HICKS CONTRACTIONS?  °Contractions that occur before labor are called Braxton Hicks contractions, or false labor. Toward the end of pregnancy (32-34 weeks), these contractions can develop more often and may become more forceful. This is not true labor because these contractions do not result in opening (dilatation) and thinning of the cervix. They are sometimes difficult to tell apart from true labor because these contractions can be forceful and people have different pain tolerances. You should not feel embarrassed if you go to the hospital with false labor. Sometimes, the only way to tell if you are in true labor is for your health care provider to look for changes in the cervix. °If there are no prenatal problems or other health problems associated with the pregnancy, it is completely safe to be sent home with false labor and await the onset of true labor. °HOW CAN YOU TELL THE DIFFERENCE BETWEEN TRUE AND FALSE LABOR? °False Labor °· The contractions of false labor are usually shorter and not as hard as those of true labor.   °· The contractions are usually irregular.   °· The contractions are often felt in the front of the lower abdomen and in the groin.   °· The contractions may go away when you walk around or change positions while lying down.   °· The contractions get weaker and are shorter lasting as time goes on.   °· The contractions do not usually become progressively stronger, regular, and closer together as with true labor.   °True Labor °· Contractions in true labor last 30-70 seconds, become very regular, usually become more intense, and increase in frequency.   °· The contractions do not go away with walking.   °· The discomfort is usually felt in the top of the uterus and spreads to the lower abdomen and low back.   °· True labor can be  determined by your health care provider with an exam. This will show that the cervix is dilating and getting thinner.   °WHAT TO REMEMBER °· Keep up with your usual exercises and follow other instructions given by your health care provider.   °· Take medicines as directed by your health care provider.   °· Keep your regular prenatal appointments.   °· Eat and drink lightly if you think you are going into labor.   °· If Braxton Hicks contractions are making you uncomfortable:   °¨ Change your position from lying down or resting to walking, or from walking to resting.   °¨ Sit and rest in a tub of warm water.   °¨ Drink 2-3 glasses of water. Dehydration may cause these contractions.   °¨ Do slow and deep breathing several times an hour.   °WHEN SHOULD I SEEK IMMEDIATE MEDICAL CARE? °Seek immediate medical care if: °· Your contractions become stronger, more regular, and closer together.   °· You have fluid leaking or gushing from your vagina.   °· You have a fever.   °· You pass blood-tinged mucus.   °· You have vaginal bleeding.   °· You have continuous abdominal pain.   °· You have low back pain that you never had before.   °· You feel your baby's head pushing down and causing pelvic pressure.   °· Your baby is not moving as much as it used to.   °Document Released: 02/12/2005 Document Revised: 02/17/2013 Document Reviewed: 11/24/2012 °[employer]® Patient Information ©2015 [employer]. This information is not intended to replace advice given to you by your health care   provider. Make sure you discuss any questions you have with your health care provider. ° °

## 2014-06-22 NOTE — MAU Note (Signed)
Pt. Urine in lab 

## 2014-07-01 ENCOUNTER — Ambulatory Visit (INDEPENDENT_AMBULATORY_CARE_PROVIDER_SITE_OTHER): Payer: Medicaid Other | Admitting: Obstetrics & Gynecology

## 2014-07-01 ENCOUNTER — Ambulatory Visit (HOSPITAL_COMMUNITY)
Admission: RE | Admit: 2014-07-01 | Discharge: 2014-07-01 | Disposition: A | Discharge status: Home / Self Care | Payer: Medicaid Other | Source: Ambulatory Visit | Attending: Obstetrics & Gynecology | Admitting: Obstetrics & Gynecology

## 2014-07-01 VITALS — BP 114/60 | HR 91 | Wt 232.0 lb

## 2014-07-01 DIAGNOSIS — O0992 Supervision of high risk pregnancy, unspecified, second trimester: Secondary | ICD-10-CM

## 2014-07-01 DIAGNOSIS — Z3A26 26 weeks gestation of pregnancy: Secondary | ICD-10-CM | POA: Diagnosis not present

## 2014-07-01 DIAGNOSIS — O368390 Maternal care for abnormalities of the fetal heart rate or rhythm, unspecified trimester, not applicable or unspecified: Secondary | ICD-10-CM

## 2014-07-01 LAB — POCT URINALYSIS DIP (DEVICE)
BILIRUBIN URINE: NEGATIVE
GLUCOSE, UA: NEGATIVE mg/dL
Hgb urine dipstick: NEGATIVE
Ketones, ur: NEGATIVE mg/dL
Leukocytes, UA: NEGATIVE
Nitrite: NEGATIVE
PH: 7 (ref 5.0–8.0)
Protein, ur: NEGATIVE mg/dL
Specific Gravity, Urine: 1.02 (ref 1.005–1.030)
UROBILINOGEN UA: 1 mg/dL (ref 0.0–1.0)

## 2014-07-01 NOTE — Progress Notes (Signed)
Fetal arrhythmia noted, will ask for MFM eval. US 4/14 nl, placenta not LL.

## 2014-07-01 NOTE — Progress Notes (Signed)
Patient to do 28 week labs at next visit.

## 2014-07-01 NOTE — Patient Instructions (Signed)
Second Trimester of Pregnancy The second trimester is from week 13 through week 28, months 4 through 6. The second trimester is often a time when you feel your best. Your body has also adjusted to being pregnant, and you begin to feel better physically. Usually, morning sickness has lessened or quit completely, you may have more energy, and you may have an increase in appetite. The second trimester is also a time when the fetus is growing rapidly. At the end of the sixth month, the fetus is about 9 inches long and weighs about 1 pounds. You will likely begin to feel the baby move (quickening) between 18 and 20 weeks of the pregnancy. BODY CHANGES Your body goes through many changes during pregnancy. The changes vary from woman to woman.   Your weight will continue to increase. You will notice your lower abdomen bulging out.  You may begin to get stretch marks on your hips, abdomen, and breasts.  You may develop headaches that can be relieved by medicines approved by your health care provider.  You may urinate more often because the fetus is pressing on your bladder.  You may develop or continue to have heartburn as a result of your pregnancy.  You may develop constipation because certain hormones are causing the muscles that push waste through your intestines to slow down.  You may develop hemorrhoids or swollen, bulging veins (varicose veins).  You may have back pain because of the weight gain and pregnancy hormones relaxing your joints between the bones in your pelvis and as a result of a shift in weight and the muscles that support your balance.  Your breasts will continue to grow and be tender.  Your gums may bleed and may be sensitive to brushing and flossing.  Dark spots or blotches (chloasma, mask of pregnancy) may develop on your face. This will likely fade after the baby is born.  A dark line from your belly button to the pubic area (linea nigra) may appear. This will likely fade  after the baby is born.  You may have changes in your hair. These can include thickening of your hair, rapid growth, and changes in texture. Some women also have hair loss during or after pregnancy, or hair that feels dry or thin. Your hair will most likely return to normal after your baby is born. WHAT TO EXPECT AT YOUR PRENATAL VISITS During a routine prenatal visit:  You will be weighed to make sure you and the fetus are growing normally.  Your blood pressure will be taken.  Your abdomen will be measured to track your baby's growth.  The fetal heartbeat will be listened to.  Any test results from the previous visit will be discussed. Your health care provider may ask you:  How you are feeling.  If you are feeling the baby move.  If you have had any abnormal symptoms, such as leaking fluid, bleeding, severe headaches, or abdominal cramping.  If you have any questions. Other tests that may be performed during your second trimester include:  Blood tests that check for:  Low iron levels (anemia).  Gestational diabetes (between 24 and 28 weeks).  Rh antibodies.  Urine tests to check for infections, diabetes, or protein in the urine.  An ultrasound to confirm the proper growth and development of the baby.  An amniocentesis to check for possible genetic problems.  Fetal screens for spina bifida and Down syndrome. HOME CARE INSTRUCTIONS   Avoid all smoking, herbs, alcohol, and unprescribed   drugs. These chemicals affect the formation and growth of the baby.  Follow your health care provider's instructions regarding medicine use. There are medicines that are either safe or unsafe to take during pregnancy.  Exercise only as directed by your health care provider. Experiencing uterine cramps is a good sign to stop exercising.  Continue to eat regular, healthy meals.  Wear a good support bra for breast tenderness.  Do not use hot tubs, steam rooms, or saunas.  Wear your  seat belt at all times when driving.  Avoid raw meat, uncooked cheese, cat litter boxes, and soil used by cats. These carry germs that can cause birth defects in the baby.  Take your prenatal vitamins.  Try taking a stool softener (if your health care provider approves) if you develop constipation. Eat more high-fiber foods, such as fresh vegetables or fruit and whole grains. Drink plenty of fluids to keep your urine clear or pale yellow.  Take warm sitz baths to soothe any pain or discomfort caused by hemorrhoids. Use hemorrhoid cream if your health care provider approves.  If you develop varicose veins, wear support hose. Elevate your feet for 15 minutes, 3-4 times a day. Limit salt in your diet.  Avoid heavy lifting, wear low heel shoes, and practice good posture.  Rest with your legs elevated if you have leg cramps or low back pain.  Visit your dentist if you have not gone yet during your pregnancy. Use a soft toothbrush to brush your teeth and be gentle when you floss.  A sexual relationship may be continued unless your health care provider directs you otherwise.  Continue to go to all your prenatal visits as directed by your health care provider. SEEK MEDICAL CARE IF:   You have dizziness.  You have mild pelvic cramps, pelvic pressure, or nagging pain in the abdominal area.  You have persistent nausea, vomiting, or diarrhea.  You have a bad smelling vaginal discharge.  You have pain with urination. SEEK IMMEDIATE MEDICAL CARE IF:   You have a fever.  You are leaking fluid from your vagina.  You have spotting or bleeding from your vagina.  You have severe abdominal cramping or pain.  You have rapid weight gain or loss.  You have shortness of breath with chest pain.  You notice sudden or extreme swelling of your face, hands, ankles, feet, or legs.  You have not felt your baby move in over an hour.  You have severe headaches that do not go away with  medicine.  You have vision changes. Document Released: 02/06/2001 Document Revised: 02/17/2013 Document Reviewed: 04/15/2012 Exit[employer] Patient Information 2015 [employer]. This information is not intended to replace advice given to you by your health care provider. Make sure you discuss any questions you have with your health care provider.  

## 2014-07-15 ENCOUNTER — Encounter: Payer: Self-pay | Admitting: *Deleted

## 2014-07-15 ENCOUNTER — Ambulatory Visit (INDEPENDENT_AMBULATORY_CARE_PROVIDER_SITE_OTHER): Payer: Medicaid Other | Admitting: Obstetrics & Gynecology

## 2014-07-15 ENCOUNTER — Encounter: Payer: Medicaid Other | Admitting: Family

## 2014-07-15 VITALS — BP 123/61 | HR 82 | Temp 98.5°F | Wt 233.4 lb

## 2014-07-15 DIAGNOSIS — Z23 Encounter for immunization: Secondary | ICD-10-CM

## 2014-07-15 DIAGNOSIS — O0992 Supervision of high risk pregnancy, unspecified, second trimester: Secondary | ICD-10-CM

## 2014-07-15 LAB — POCT URINALYSIS DIP (DEVICE)
BILIRUBIN URINE: NEGATIVE
Glucose, UA: NEGATIVE mg/dL
Ketones, ur: NEGATIVE mg/dL
LEUKOCYTES UA: NEGATIVE
Nitrite: NEGATIVE
PH: 6.5 (ref 5.0–8.0)
PROTEIN: NEGATIVE mg/dL
Specific Gravity, Urine: 1.01 (ref 1.005–1.030)
Urobilinogen, UA: 0.2 mg/dL (ref 0.0–1.0)

## 2014-07-15 LAB — CBC
HEMATOCRIT: 31.9 % — AB (ref 36.0–46.0)
HEMOGLOBIN: 10.7 g/dL — AB (ref 12.0–15.0)
MCH: 27.2 pg (ref 26.0–34.0)
MCHC: 33.5 g/dL (ref 30.0–36.0)
MCV: 81 fL (ref 78.0–100.0)
MPV: 10 fL (ref 8.6–12.4)
Platelets: 212 10*3/uL (ref 150–400)
RBC: 3.94 MIL/uL (ref 3.87–5.11)
RDW: 13.9 % (ref 11.5–15.5)
WBC: 6.8 10*3/uL (ref 4.0–10.5)

## 2014-07-15 LAB — HIV ANTIBODY (ROUTINE TESTING W REFLEX): HIV 1&2 Ab, 4th Generation: NONREACTIVE

## 2014-07-15 MED ORDER — TETANUS-DIPHTH-ACELL PERTUSSIS 5-2.5-18.5 LF-MCG/0.5 IM SUSP
0.5000 mL | Freq: Once | INTRAMUSCULAR | Status: AC
  Administered 2014-07-15 (×2): 0.5 mL via INTRAMUSCULAR

## 2014-07-15 NOTE — Patient Instructions (Signed)
Third Trimester of Pregnancy The third trimester is from week 29 through week 42, months 7 through 9. The third trimester is a time when the fetus is growing rapidly. At the end of the ninth month, the fetus is about 20 inches in length and weighs 6-10 pounds.  BODY CHANGES Your body goes through many changes during pregnancy. The changes vary from woman to woman.   Your weight will continue to increase. You can expect to gain 25-35 pounds (11-16 kg) by the end of the pregnancy.  You may begin to get stretch marks on your hips, abdomen, and breasts.  You may urinate more often because the fetus is moving lower into your pelvis and pressing on your bladder.  You may develop or continue to have heartburn as a result of your pregnancy.  You may develop constipation because certain hormones are causing the muscles that push waste through your intestines to slow down.  You may develop hemorrhoids or swollen, bulging veins (varicose veins).  You may have pelvic pain because of the weight gain and pregnancy hormones relaxing your joints between the bones in your pelvis. Backaches may result from overexertion of the muscles supporting your posture.  You may have changes in your hair. These can include thickening of your hair, rapid growth, and changes in texture. Some women also have hair loss during or after pregnancy, or hair that feels dry or thin. Your hair will most likely return to normal after your baby is born.  Your breasts will continue to grow and be tender. A yellow discharge may leak from your breasts called colostrum.  Your belly button may stick out.  You may feel short of breath because of your expanding uterus.  You may notice the fetus "dropping," or moving lower in your abdomen.  You may have a bloody mucus discharge. This usually occurs a few days to a week before labor begins.  Your cervix becomes thin and soft (effaced) near your due date. WHAT TO EXPECT AT YOUR PRENATAL  EXAMS  You will have prenatal exams every 2 weeks until week 36. Then, you will have weekly prenatal exams. During a routine prenatal visit:  You will be weighed to make sure you and the fetus are growing normally.  Your blood pressure is taken.  Your abdomen will be measured to track your baby's growth.  The fetal heartbeat will be listened to.  Any test results from the previous visit will be discussed.  You may have a cervical check near your due date to see if you have effaced. At around 36 weeks, your caregiver will check your cervix. At the same time, your caregiver will also perform a test on the secretions of the vaginal tissue. This test is to determine if a type of bacteria, Group B streptococcus, is present. Your caregiver will explain this further. Your caregiver may ask you:  What your birth plan is.  How you are feeling.  If you are feeling the baby move.  If you have had any abnormal symptoms, such as leaking fluid, bleeding, severe headaches, or abdominal cramping.  If you have any questions. Other tests or screenings that may be performed during your third trimester include:  Blood tests that check for low iron levels (anemia).  Fetal testing to check the health, activity level, and growth of the fetus. Testing is done if you have certain medical conditions or if there are problems during the pregnancy. FALSE LABOR You may feel small, irregular contractions that   eventually go away. These are called Braxton Hicks contractions, or false labor. Contractions may last for hours, days, or even weeks before true labor sets in. If contractions come at regular intervals, intensify, or become painful, it is best to be seen by your caregiver.  SIGNS OF LABOR   Menstrual-like cramps.  Contractions that are 5 minutes apart or less.  Contractions that start on the top of the uterus and spread down to the lower abdomen and back.  A sense of increased pelvic pressure or back  pain.  A watery or bloody mucus discharge that comes from the vagina. If you have any of these signs before the 37th week of pregnancy, call your caregiver right away. You need to go to the hospital to get checked immediately. HOME CARE INSTRUCTIONS   Avoid all smoking, herbs, alcohol, and unprescribed drugs. These chemicals affect the formation and growth of the baby.  Follow your caregiver's instructions regarding medicine use. There are medicines that are either safe or unsafe to take during pregnancy.  Exercise only as directed by your caregiver. Experiencing uterine cramps is a good sign to stop exercising.  Continue to eat regular, healthy meals.  Wear a good support bra for breast tenderness.  Do not use hot tubs, steam rooms, or saunas.  Wear your seat belt at all times when driving.  Avoid raw meat, uncooked cheese, cat litter boxes, and soil used by cats. These carry germs that can cause birth defects in the baby.  Take your prenatal vitamins.  Try taking a stool softener (if your caregiver approves) if you develop constipation. Eat more high-fiber foods, such as fresh vegetables or fruit and whole grains. Drink plenty of fluids to keep your urine clear or pale yellow.  Take warm sitz baths to soothe any pain or discomfort caused by hemorrhoids. Use hemorrhoid cream if your caregiver approves.  If you develop varicose veins, wear support hose. Elevate your feet for 15 minutes, 3-4 times a day. Limit salt in your diet.  Avoid heavy lifting, wear low heal shoes, and practice good posture.  Rest a lot with your legs elevated if you have leg cramps or low back pain.  Visit your dentist if you have not gone during your pregnancy. Use a soft toothbrush to brush your teeth and be gentle when you floss.  A sexual relationship may be continued unless your caregiver directs you otherwise.  Do not travel far distances unless it is absolutely necessary and only with the approval  of your caregiver.  Take prenatal classes to understand, practice, and ask questions about the labor and delivery.  Make a trial run to the hospital.  Pack your hospital bag.  Prepare the baby's nursery.  Continue to go to all your prenatal visits as directed by your caregiver. SEEK MEDICAL CARE IF:  You are unsure if you are in labor or if your water has broken.  You have dizziness.  You have mild pelvic cramps, pelvic pressure, or nagging pain in your abdominal area.  You have persistent nausea, vomiting, or diarrhea.  You have a bad smelling vaginal discharge.  You have pain with urination. SEEK IMMEDIATE MEDICAL CARE IF:   You have a fever.  You are leaking fluid from your vagina.  You have spotting or bleeding from your vagina.  You have severe abdominal cramping or pain.  You have rapid weight loss or gain.  You have shortness of breath with chest pain.  You notice sudden or extreme swelling   of your face, hands, ankles, feet, or legs.  You have not felt your baby move in over an hour.  You have severe headaches that do not go away with medicine.  You have vision changes. Document Released: 02/06/2001 Document Revised: 02/17/2013 Document Reviewed: 04/15/2012 [employer] Patient Information 2015 [employer]. This information is not intended to replace advice given to you by your health care provider. Make sure you discuss any questions you have with your health care provider.  

## 2014-07-15 NOTE — Progress Notes (Signed)
Pt reports having a lot of pressure in vaginal area  28 wk labs today Tdap vaccine consented and info given

## 2014-07-15 NOTE — Progress Notes (Signed)
BTL papers signed, copy given to pt.

## 2014-07-15 NOTE — Progress Notes (Signed)
US shoed NSR last time and cx length was >4 cm. BHC last night

## 2014-07-16 LAB — GLUCOSE TOLERANCE, 1 HOUR (50G) W/O FASTING: GLUCOSE 1 HOUR GTT: 128 mg/dL (ref 70–140)

## 2014-07-16 LAB — RPR

## 2014-08-05 ENCOUNTER — Ambulatory Visit (INDEPENDENT_AMBULATORY_CARE_PROVIDER_SITE_OTHER): Payer: Medicaid Other | Admitting: Obstetrics & Gynecology

## 2014-08-05 VITALS — BP 116/62 | HR 97 | Temp 98.9°F | Wt 231.1 lb

## 2014-08-05 DIAGNOSIS — O0993 Supervision of high risk pregnancy, unspecified, third trimester: Secondary | ICD-10-CM

## 2014-08-05 NOTE — Progress Notes (Signed)
Here for ob check. States was taking ibuprofen occasionally for bad headaches. Instructed her not to take ibuprofen in pregnancy and to discuss with provider today. C/o more contractions than usual ,and pressure.

## 2014-08-05 NOTE — Progress Notes (Signed)
Subjective:cc: lots of contractions   Paige Bates is a 29 y.o. [id number] at [redacted]w[redacted]d being seen today for ongoing prenatal care.  Patient reports contractions since several days.  Contractions: Irregular.  Vag. Bleeding: None.  Fetal Movement: Present. Denies leaking of fluid.   The following portions of the patient's history were reviewed and updated as appropriate: allergies, current medications, past family history, past medical history, past social history, past surgical history and problem list.   Objective:   Filed Vitals:   08/05/14 0828  BP: 116/62  Pulse: 97  Temp: 98.9 F (37.2 C)  Weight: 231 lb 1.6 oz (104.826 kg)    Fetal Status: Fetal Heart Rate (bpm): 153   Movement: Present     General:  Alert, oriented and cooperative. Patient is in no acute distress.  Skin: Skin is warm and dry. No rash noted.   Cardiovascular: Normal heart rate noted  Respiratory: Effort normal, no problems with respiration noted  Abdomen: Soft, gravid, appropriate for gestational age. Pain/Pressure: Present     Vaginal: Vag. Bleeding: None.       Cervix: 10%/ closed  Extremities: Normal range of motion.  Edema: Trace  Mental Status: Normal mood and affect. Normal behavior. Normal judgment and thought content.   Urinalysis:       Assessment and Plan:   Pregnancy: [id number] at [redacted]w[redacted]d  There are no diagnoses linked to this encounter.  Preterm labor symptoms and general obstetric precautions including but not limited to vaginal bleeding, contractions, leaking of fluid and fetal movement were reviewed in detail with the patient.  Please refer to After Visit Summary for other counseling recommendations.  2 weeks f/u Adam Phenix, MD

## 2014-08-05 NOTE — Patient Instructions (Signed)

## 2014-08-06 LAB — POCT URINALYSIS DIP (DEVICE)
Glucose, UA: NEGATIVE mg/dL
Ketones, ur: NEGATIVE mg/dL
NITRITE: NEGATIVE
PH: 7 (ref 5.0–8.0)
Protein, ur: 30 mg/dL — AB
Specific Gravity, Urine: 1.025 (ref 1.005–1.030)
Urobilinogen, UA: 2 mg/dL — ABNORMAL HIGH (ref 0.0–1.0)

## 2014-08-16 ENCOUNTER — Inpatient Hospital Stay (HOSPITAL_COMMUNITY)
Admission: AD | Admit: 2014-08-16 | Discharge: 2014-08-17 | Disposition: A | Discharge status: Home / Self Care | Payer: Medicaid Other | Source: Ambulatory Visit | Attending: Obstetrics and Gynecology | Admitting: Obstetrics and Gynecology

## 2014-08-16 ENCOUNTER — Encounter (HOSPITAL_COMMUNITY): Payer: Self-pay | Admitting: *Deleted

## 2014-08-16 DIAGNOSIS — O4703 False labor before 37 completed weeks of gestation, third trimester: Secondary | ICD-10-CM

## 2014-08-16 DIAGNOSIS — Z3A33 33 weeks gestation of pregnancy: Secondary | ICD-10-CM | POA: Diagnosis not present

## 2014-08-16 LAB — URINALYSIS, ROUTINE W REFLEX MICROSCOPIC
BILIRUBIN URINE: NEGATIVE
Glucose, UA: NEGATIVE mg/dL
KETONES UR: NEGATIVE mg/dL
LEUKOCYTES UA: NEGATIVE
NITRITE: NEGATIVE
PH: 6 (ref 5.0–8.0)
Protein, ur: NEGATIVE mg/dL
UROBILINOGEN UA: 0.2 mg/dL (ref 0.0–1.0)

## 2014-08-16 LAB — URINE MICROSCOPIC-ADD ON

## 2014-08-16 NOTE — Discharge Instructions (Signed)

## 2014-08-16 NOTE — MAU Note (Signed)
Pt states that she started procardia a couple of months ago but found it was not working last week so she stopped taking it.

## 2014-08-16 NOTE — MAU Note (Signed)
PT SAYS  SHE FEELS  UC    X4 MTHS   BUT  TODAY  BECAME  STRONGER  AND FEELS  PRESSURE .  [facility]-- CLINIC.     VE  2 WEEKS  AGO    CLOSED.      DENIES HSV AND MRSA.     LAST SEX-     Friday.

## 2014-08-16 NOTE — MAU Provider Note (Signed)
History     CSN: [id number]  Arrival date and time: 08/16/14 2231   None     No chief complaint on file.  HPI Patient is 29 y.o. [id number] [redacted]w[redacted]d here with complaints of worsening contractions and pelvic pressure. She has been having contractions for a few months. However, they have been getting worse over the last few weeks. She feels a lot of pressure, "like the baby is right there." She denies any LOF, VB or vaginal discharge. Denies any urinary symptoms. Feels regular fetal movement. She has previously been on procardia but stopped taking it last week because she wasn't sure it was working.    OB History    Gravida Para Term Preterm AB TAB SAB Ectopic Multiple Living   0 1 1 0 0 0 3      Past Medical History  Diagnosis Date  . Anemia   . Preterm labor     PTL with 2nd child  . Chronic hepatitis B   . Eczema   . Headache   . Depression     no meds; PP with 2nd prg    Past Surgical History  Procedure Laterality Date  . No past surgeries    . Tooth extraction      Family History  Problem Relation Age of Onset  . Other Neg Hx   . Asthma Neg Hx   . Cancer Neg Hx   . Diabetes Neg Hx   . Hearing loss Neg Hx   . Heart disease Neg Hx   . Hypertension Neg Hx   . Stroke Neg Hx     History  Substance Use Topics  . Smoking status: Never Smoker   . Smokeless tobacco: Never Used  . Alcohol Use: No    Allergies:  Allergies  Allergen Reactions  . Latex Itching and Rash    Prescriptions prior to admission  Medication Sig Dispense Refill Last Dose  . NIFEdipine (PROCARDIA) 10 MG capsule Take 1 capsule (10 mg total) by mouth every 10 (ten) minutes as needed (up to 3 doses, if require more go to MAU). 30 capsule 0 Past Week at Unknown time  . Prenatal Vit-Fe Fumarate-FA (PRENATAL VITAMIN) 27-0.8 MG TABS Take 1 tablet by mouth daily. 30 tablet 11 08/16/2014 at Unknown time  . Doxylamine-Pyridoxine 10-10 MG TBEC 2 PO qhs; may take 1po in am and 1po in afternoon prn  nausea 120 tablet 3 More than a month at Unknown time    Review of Systems  Constitutional: Negative for fever and chills.  Eyes: Negative for blurred vision and double vision.  Respiratory: Negative for shortness of breath.   Cardiovascular: Negative for chest pain and leg swelling.  Gastrointestinal: Negative for heartburn, nausea, vomiting, diarrhea and constipation.  Genitourinary: Negative for dysuria and urgency.       No contractions, vaginal bleeding, itching or discharge  Skin: Negative for itching and rash.  Neurological: Negative for dizziness and headaches.   Physical Exam   Blood pressure 111/66, pulse 90, temperature 98.2 F (36.8 C), temperature source Oral, resp. rate 20, height  (1.626 m), weight 106.709 kg (235 lb 4 oz), last menstrual period 12/28/2013, currently breastfeeding.  Physical Exam  Constitutional: She is oriented to person, place, and time. She appears well-developed and well-nourished.  HENT:  Head: Normocephalic and atraumatic.  Eyes: Conjunctivae are normal.  Neck: Normal range of motion.  Cardiovascular:  No murmur heard. Respiratory: No respiratory distress. She has no wheezes.  GI: Soft. Bowel sounds are normal. She exhibits no distension. There is no tenderness.  Genitourinary:  External genitalia normal. SVE with cervix closed, 10%, posterior, high  Musculoskeletal: Normal range of motion. She exhibits no edema.  Neurological: She is alert and oriented to person, place, and time.  Skin: Skin is warm and dry.  Psychiatric: She has a normal mood and affect.    MAU Course  Procedures  MDM 29 y.o. [id number] at [redacted]w[redacted]d presenting with persistent contractions and pelvic pressure. Contractions have been present for months, worse over the last few weeks in the setting of stopping her procardia. Exam without any cervical change from prior. Monitoring shows FHT Cat I, contractions. Urinalysis unremarkable.   Assessment and Plan  Patient is 29  y.o. [id number] [redacted]w[redacted]d reporting contractions and pelvic pressure likely secondary to preterm contractions without cervical change. Discussed with patient the safety of taking procardia if desired to help with sleep during contractions. Can also try unisom if desired. Discussed preterm labor precautions. Patient will continue regular OB follow-up.   - fetal kick counts reinforced - preterm labor precautions  Patient history, exam, assessment and plan discussed with Dr. Su Hilt.  Fabio Asa 08/16/2014, 11:14 PM

## 2014-08-16 NOTE — MAU Note (Signed)
Dr. Delanna Ahmadi from faculty coming to examine patient as patient states she is having pelvic pressure.

## 2014-08-19 ENCOUNTER — Ambulatory Visit (INDEPENDENT_AMBULATORY_CARE_PROVIDER_SITE_OTHER): Payer: Medicaid Other | Admitting: Obstetrics & Gynecology

## 2014-08-19 VITALS — BP 111/67 | HR 96 | Temp 98.8°F | Wt 233.9 lb

## 2014-08-19 DIAGNOSIS — O0993 Supervision of high risk pregnancy, unspecified, third trimester: Secondary | ICD-10-CM

## 2014-08-19 LAB — POCT URINALYSIS DIP (DEVICE)
BILIRUBIN URINE: NEGATIVE
Glucose, UA: NEGATIVE mg/dL
Ketones, ur: NEGATIVE mg/dL
NITRITE: NEGATIVE
Protein, ur: 30 mg/dL — AB
Specific Gravity, Urine: 1.02 (ref 1.005–1.030)
Urobilinogen, UA: 0.2 mg/dL (ref 0.0–1.0)
pH: 7 (ref 5.0–8.0)

## 2014-08-19 MED ORDER — PANTOPRAZOLE SODIUM 40 MG PO TBEC: 40.0000 mg | DELAYED_RELEASE_TABLET | Freq: Every day | ORAL | Status: DC

## 2014-08-19 NOTE — Progress Notes (Signed)
Nurse note reviewed Subjective:reflux and nausea  Paige Bates is a 29 y.o. [id number] at [redacted]w[redacted]d being seen today for ongoing prenatal care.  Patient reports fatigue, heartburn and nausea.  Contractions: Irregular.  Vag. Bleeding: Scant. Movement: Present. Denies leaking of fluid.   The following portions of the patient's history were reviewed and updated as appropriate: allergies, current medications, past family history, past medical history, past social history, past surgical history and problem list.   Objective:areflux and nausea   Filed Vitals:   08/19/14 1009  BP: 111/67  Pulse: 96  Temp: 98.8 F (37.1 C)  Weight: 233 lb 14.4 oz (106.096 kg)    Fetal Status:     Movement: Present     General:  Alert, oriented and cooperative. Patient is in no acute distress.  Skin: Skin is warm and dry. No rash noted.   Cardiovascular: Normal heart rate noted  Respiratory: Effort  normal, no problems with respiration noted  Abdomen: Soft, gravid, appropriate for gestational age. Pain/Pressure: Present     Vaginal: Vag. Bleeding: Scant.       Cervix: Exam revealed      closed   Extremities: Normal range of motion.  Edema: Trace  Mental Status: Normal mood and affect. Normal behavior. Normal judgment and thought content.   Urinalysis: Urine Protein: 1+ Urine Glucose: Negative  Assessment and Plan:  Pregnancy: [id number] at [redacted]w[redacted]d  There are no diagnoses linked to this encounter.  Preterm labor symptoms and general obstetric precautions including but not limited to vaginal bleeding, contractions, leaking of fluid and fetal movement were reviewed in detail with the patient.  Please refer to After Visit Summary for other counseling recommendations.   2 week f/u. Protonix for reflux  Adam Phenix, MD

## 2014-08-19 NOTE — Patient Instructions (Signed)

## 2014-08-19 NOTE — Progress Notes (Signed)
Patient c/o vaginal itching - wants treatment. C/o vaginal spotting bright red blood Tuesday night- came to MAU- also was having contractions. States no bleeding since then but contractions worse. States having contractions often- too much to keep track of. Continues to have nausea and vomiting often.

## 2014-09-02 ENCOUNTER — Ambulatory Visit (INDEPENDENT_AMBULATORY_CARE_PROVIDER_SITE_OTHER): Payer: Self-pay | Admitting: Family Medicine

## 2014-09-02 VITALS — BP 119/69 | HR 91 | Temp 98.3°F | Wt 235.7 lb

## 2014-09-02 DIAGNOSIS — B373 Candidiasis of vulva and vagina: Secondary | ICD-10-CM

## 2014-09-02 DIAGNOSIS — B3731 Acute candidiasis of vulva and vagina: Secondary | ICD-10-CM

## 2014-09-02 DIAGNOSIS — O0993 Supervision of high risk pregnancy, unspecified, third trimester: Secondary | ICD-10-CM

## 2014-09-02 DIAGNOSIS — O368390 Maternal care for abnormalities of the fetal heart rate or rhythm, unspecified trimester, not applicable or unspecified: Secondary | ICD-10-CM

## 2014-09-02 DIAGNOSIS — O4703 False labor before 37 completed weeks of gestation, third trimester: Secondary | ICD-10-CM

## 2014-09-02 DIAGNOSIS — M5430 Sciatica, unspecified side: Secondary | ICD-10-CM

## 2014-09-02 DIAGNOSIS — O98813 Other maternal infectious and parasitic diseases complicating pregnancy, third trimester: Secondary | ICD-10-CM

## 2014-09-02 LAB — POCT URINALYSIS DIP (DEVICE)
Bilirubin Urine: NEGATIVE
Glucose, UA: NEGATIVE mg/dL
Ketones, ur: NEGATIVE mg/dL
Nitrite: NEGATIVE
PROTEIN: NEGATIVE mg/dL
Specific Gravity, Urine: 1.015 (ref 1.005–1.030)
UROBILINOGEN UA: 0.2 mg/dL (ref 0.0–1.0)
pH: 7 (ref 5.0–8.0)

## 2014-09-02 MED ORDER — MICONAZOLE NITRATE 1200 & 2 MG & % VA KIT: 1.0000 | PACK | Freq: Once | VAGINAL | Status: DC

## 2014-09-02 NOTE — Progress Notes (Signed)
Subjective:  Paige Bates is a 29 y.o. [id number] at 3022w3d being seen today for ongoing prenatal care.  Patient reports low back pain worse at night, no numbness or loss of bowel bladder, as well as white vaginal discharge consistent w/ prior yeast infections, and continued braxton hicks contractions up to 20 a day.  Contractions: Irregular.  Vag. Bleeding: None. Movement: Present. Denies leaking of fluid.   The following portions of the patient's history were reviewed and updated as appropriate: allergies, current medications, past family history, past medical history, past social history, past surgical history and problem list.   Objective:   Filed Vitals:   09/02/14 1025  BP: 119/69  Pulse: 91  Temp: 98.3 F (36.8 C)  Weight: 235 lb 11.2 oz (106.913 kg)    Fetal Status: Fetal Heart Rate (bpm): 146   Movement: Present     General:  Alert, oriented and cooperative. Patient is in no acute distress.  Skin: Skin is warm and dry. No rash noted.   Cardiovascular: Normal heart rate noted  Respiratory: Normal respiratory effort, no problems with respiration noted  Abdomen: Soft, gravid, appropriate for gestational age. Pain/Pressure: Present     Vaginal: Vag. Bleeding: None.       Cervix: Exam revealed1/thick/high        Extremities: Normal range of motion.  Edema: Trace  Mental Status: Normal mood and affect. Normal behavior. Normal judgment and thought content.   Urinalysis: Urine Protein: Negative Urine Glucose: Negative  Assessment and Plan:  Pregnancy: [id number] at 5522w3d  1. Supervision of high risk pregnancy in third trimester - f/u one week for GBS and gc/chlamydia  2. Fetal arrhythmia affecting pregnancy, antepartum Reviewed MFM echo, no f/u recommended unless FHR irregularity observed again  3. Preterm contractions, third trimester Discussed hydration, use of tylenol prn  4. Sciatica, unspecified laterality Without signs of neurologic compromise. Discussed use of supportive  "belly-band" and tylenol prn  5. Yeast vaginitis Monistat 1-day    Preterm labor symptoms and general obstetric precautions including but not limited to vaginal bleeding, contractions, leaking of fluid and fetal movement were reviewed in detail with the patient.  Please refer to After Visit Summary for other counseling recommendations.   Return in about 1 week (around 09/09/2014).   Kathrynn RunningNoah Bedford Roel Douthat, MD

## 2014-09-13 ENCOUNTER — Ambulatory Visit (INDEPENDENT_AMBULATORY_CARE_PROVIDER_SITE_OTHER): Payer: Self-pay | Admitting: Obstetrics & Gynecology

## 2014-09-13 ENCOUNTER — Telehealth: Payer: Self-pay | Admitting: General Practice

## 2014-09-13 VITALS — BP 128/73 | HR 91 | Temp 98.7°F | Wt 238.7 lb

## 2014-09-13 DIAGNOSIS — O0993 Supervision of high risk pregnancy, unspecified, third trimester: Secondary | ICD-10-CM

## 2014-09-13 LAB — POCT URINALYSIS DIP (DEVICE)
BILIRUBIN URINE: NEGATIVE
Glucose, UA: NEGATIVE mg/dL
Ketones, ur: NEGATIVE mg/dL
Leukocytes, UA: NEGATIVE
Nitrite: NEGATIVE
PROTEIN: NEGATIVE mg/dL
Specific Gravity, Urine: 1.015 (ref 1.005–1.030)
UROBILINOGEN UA: 0.2 mg/dL (ref 0.0–1.0)
pH: 7 (ref 5.0–8.0)

## 2014-09-13 LAB — OB RESULTS CONSOLE GC/CHLAMYDIA
Chlamydia: NEGATIVE
Gonorrhea: NEGATIVE

## 2014-09-13 LAB — OB RESULTS CONSOLE GBS: STREP GROUP B AG: NEGATIVE

## 2014-09-13 NOTE — Progress Notes (Signed)
Reviewed tip of week with patient  

## 2014-09-13 NOTE — Telephone Encounter (Signed)
Called patient to inquire if she signed her BTL papers. Per chart review, patient signed BTL papers on 07/15/14 however no copy is on file. Patient states she signed her papers two months ago with Nehemiah SettleBrooke and she cannot find her copy. Told patient we will look around the office but that at the moment I am unable to find our copy and she may need to sign a new one. Also discussed that she needs to call her case worker for medicaid to have the provider removed off her card otherwise we cannot file her medicaid card. Patient verbalized understanding and had no other questions

## 2014-09-13 NOTE — Progress Notes (Signed)
Subjective:  Paige Bates is a 29 y.o. [id number] at 7344w0d being seen today for ongoing prenatal care.  Patient reports no complaints.  Contractions: Irregular.  Vag. Bleeding: None. Movement: Present. Denies leaking of fluid.   The following portions of the patient's history were reviewed and updated as appropriate: allergies, current medications, past family history, past medical history, past social history, past surgical history and problem list.   Objective:   Filed Vitals:   09/13/14 1012  BP: 128/73  Pulse: 91  Temp: 98.7 F (37.1 C)  Weight: 238 lb 11.2 oz (108.274 kg)    Fetal Status: Fetal Heart Rate (bpm): 147   Movement: Present     General:  Alert, oriented and cooperative. Patient is in no acute distress.  Skin: Skin is warm and dry. No rash noted.   Cardiovascular: Normal heart rate noted  Respiratory: Normal respiratory effort, no problems with respiration noted  Abdomen: Soft, gravid, appropriate for gestational age. Pain/Pressure: Present     Vaginal: Vag. Bleeding: None.    Vag D/C Character: Mucous  Cervix: Not evaluated        Extremities: Normal range of motion.  Edema: Trace  Mental Status: Normal mood and affect. Normal behavior. Normal judgment and thought content.   Urinalysis: Urine Protein: Negative Urine Glucose: Negative  Assessment and Plan:  Pregnancy: [id number] at 4144w0d  1. Supervision of high risk pregnancy in third trimester - Culture, beta strep (group b only) - GC/chlamydia probe amp, urine  Term labor symptoms and general obstetric precautions including but not limited to vaginal bleeding, contractions, leaking of fluid and fetal movement were reviewed in detail with the patient. Please refer to After Visit Summary for other counseling recommendations.  No Follow-up on file.   Lesly DukesKelly H Laronda Lisby, MD

## 2014-09-15 ENCOUNTER — Telehealth: Payer: Self-pay | Admitting: General Practice

## 2014-09-15 LAB — CULTURE, BETA STREP (GROUP B ONLY)

## 2014-09-15 NOTE — Telephone Encounter (Signed)
Called patient in regards to BTL papers. Discussed with patient that she needs to come in to sign her BTL papers as we do not have a copy on file or she can bring us her copy. Patient states that she'll come in and sign the papers but we are putting the original date on there because she signed them that day. Told patient that we cannot do that. Patient states oh yes you can because I spent 30 minutes on the phone with medicaid yesterday and they said you can. Patient states she will be having her tubes tied when she comes in to have this baby. Patient states this baby is coming before August and we will be tying her tubes before she leaves home. Asked patient to come in and sign BTL papers and that she can speak with our office manager when she gets here. Patient verbalized understanding and states she is on her way. Patient had no other questions

## 2014-09-16 ENCOUNTER — Telehealth: Payer: Self-pay | Admitting: General Practice

## 2014-09-16 ENCOUNTER — Encounter (HOSPITAL_COMMUNITY): Payer: Self-pay

## 2014-09-16 LAB — GC/CHLAMYDIA PROBE AMP, URINE
CHLAMYDIA, SWAB/URINE, PCR: NEGATIVE
GC Probe Amp, Urine: NEGATIVE

## 2014-09-16 NOTE — Telephone Encounter (Signed)
BTS consent located in a different chart with patient's name spelled Paige Bates. Called patient to make her aware that it was located, no answer- left message stating we are trying to reach you, please call us back at the clinics

## 2014-09-16 NOTE — Telephone Encounter (Signed)
Opened in error

## 2014-09-19 ENCOUNTER — Encounter (HOSPITAL_COMMUNITY): Payer: Self-pay | Admitting: *Deleted

## 2014-09-19 ENCOUNTER — Inpatient Hospital Stay (HOSPITAL_COMMUNITY)
Admission: AD | Admit: 2014-09-19 | Discharge: 2014-09-19 | Disposition: A | Discharge status: Home / Self Care | Payer: Medicaid Other | Source: Ambulatory Visit | Attending: Obstetrics & Gynecology | Admitting: Obstetrics & Gynecology

## 2014-09-19 DIAGNOSIS — Z3493 Encounter for supervision of normal pregnancy, unspecified, third trimester: Secondary | ICD-10-CM | POA: Diagnosis present

## 2014-09-19 NOTE — MAU Note (Signed)
Contractions since Friday night. Denies LOF or bleeding

## 2014-09-20 ENCOUNTER — Ambulatory Visit (INDEPENDENT_AMBULATORY_CARE_PROVIDER_SITE_OTHER): Payer: Self-pay | Admitting: Obstetrics and Gynecology

## 2014-09-20 VITALS — BP 136/75 | HR 93 | Temp 98.4°F | Wt 238.0 lb

## 2014-09-20 DIAGNOSIS — O0993 Supervision of high risk pregnancy, unspecified, third trimester: Secondary | ICD-10-CM

## 2014-09-20 LAB — POCT URINALYSIS DIP (DEVICE)
BILIRUBIN URINE: NEGATIVE
GLUCOSE, UA: NEGATIVE mg/dL
Hgb urine dipstick: NEGATIVE
Ketones, ur: NEGATIVE mg/dL
Leukocytes, UA: NEGATIVE
Nitrite: NEGATIVE
Protein, ur: NEGATIVE mg/dL
Specific Gravity, Urine: 1.02 (ref 1.005–1.030)
UROBILINOGEN UA: 1 mg/dL (ref 0.0–1.0)
pH: 7 (ref 5.0–8.0)

## 2014-09-20 NOTE — Progress Notes (Signed)
Subjective:  Paige Bates is a 29 y.o. [id number] at [redacted]w[redacted]d being seen today for ongoing prenatal care.  Patient reports no complaints.  Contractions: Irregular and present for 4-5 months increasing in frequency and intensity.  Vag. Bleeding: None. Movement: Present. Denies leaking of fluid. She reports losing her mucus plug 2 weeks ago. She endorses nausea and vomiting. The nausea occurs on a daily basis and the vomiting occurs 2x a week, which is down form the 4-5x a week of vomiting that she was experiencing earlier in her pregnancy. Denies any dizziness or SOB. She is experiencing some edema in her lower extremities that she says comes and goes.  Medication: She was taking Nocardia to manage her contractions but they were not helping and causing her to experience a rapid heartbeat, so she stopped taking it about 2-3 months ago. OB history pertinent for: PP depression and preterm labor w/2nd child, chronic Hep B.  The following portions of the patient's history were reviewed and updated as appropriate: allergies, current medications, past family history, past medical history, past social history, past surgical history and problem list.   Objective:   Filed Vitals:   09/20/14 1114  BP: 136/75  Pulse: 93  Temp: 98.4 F (36.9 C)  Weight: 238 lb (107.956 kg)    Fetal Status: Fetal Heart Rate (bpm): 146   Movement: Present     General:  Alert, oriented and cooperative. Patient is in no acute distress.  Skin: Skin is warm and dry. No rash noted.   Cardiovascular: Normal heart rate noted  Respiratory: Normal respiratory effort, no problems with respiration noted  Abdomen: Soft, gravid, appropriate for gestational age. Pain/Pressure: Present     Vaginal: Vag. Bleeding: None.       Cervix: Done by Caren Griffins        Extremities: Normal range of motion.  Edema: Trace  Mental Status: Normal mood and affect. Normal behavior. Normal judgment and thought content.   Urinalysis: Urine Protein:  Negative Urine Glucose: Negative     Assessment and Plan:  Pregnancy: G5P3013 at [redacted]w[redacted]d  1. Supervision of high risk pregnancy in third trimester Past history of SVD w/macrosomnic baby(10lbs in previous pregnancy)  2. Postpartum contraception- patient has signed papers and has approved for BTL post partum.  3. Urinalysis/culture negative for GC/chylamdia probe negative, GBS status is negative, prophylaxis is not necessary.   Term labor symptoms and general obstetric precautions including but not limited to vaginal bleeding, contractions, leaking of fluid and fetal movement were reviewed in detail with the patient.  Please refer to After Visit Summary for other counseling recommendations.  Return in about 1 week (around 09/27/2014).   Kandyce Rud, Med Student

## 2014-09-20 NOTE — Progress Notes (Signed)
Subjective:  Paige Bates is a 29 y.o. [id number] at [redacted]w[redacted]d being seen today for ongoing prenatal care.  Patient reports occasional contractions.  Contractions: Irregular.  Vag. Bleeding: None. Movement: Present. Denies leaking of fluid.  Concerned baby too big.  The following portions of the patient's history were reviewed and updated as appropriate: allergies, current medications, past family history, past medical history, past social history, past surgical history and problem list.   Objective:   Filed Vitals:   09/20/14 1114  BP: 136/75  Pulse: 93  Temp: 98.4 F (36.9 C)  Weight: 238 lb (107.956 kg)    Fetal Status: Fetal Heart Rate (bpm): 146   Movement: Present     General:  Alert, oriented and cooperative. Patient is in no acute distress.  Skin: Skin is warm and dry. No rash noted.   Cardiovascular: Normal heart rate noted  Respiratory: Normal respiratory effort, no problems with respiration noted  Abdomen: Soft, gravid, appropriate for gestational age. Pain/Pressure: Present     Vaginal: Vag. Bleeding: None.       Cervix: Exam revealed      see above. EFW 8-8 1/2#  Extremities: Normal range of motion.  Edema: Trace  Mental Status: Normal mood and affect. Normal behavior. Normal judgment and thought content.   Urinalysis: Urine Protein: Negative Urine Glucose: Negative  Assessment and Plan:  Pregnancy: G5P3013 at [redacted]w[redacted]d  1. Supervision of high risk pregnancy in third trimester Obese and hx SVD with 9#6oz baby> reassured re EFW Attempted to strip membranes  Term labor symptoms and general obstetric precautions including but not limited to vaginal bleeding, contractions, leaking of fluid and fetal movement were reviewed in detail with the patient. Please refer to After Visit Summary for other counseling recommendations.  Return in about 1 week (around 09/27/2014).   Danae Orleans, CNM

## 2014-09-20 NOTE — Patient Instructions (Signed)
Third Trimester of Pregnancy The third trimester is from week 29 through week 42, months 7 through 9. The third trimester is a time when the fetus is growing rapidly. At the end of the ninth month, the fetus is about 20 inches in length and weighs 6-10 pounds.  BODY CHANGES Your body goes through many changes during pregnancy. The changes vary from woman to woman.   Your weight will continue to increase. You can expect to gain 25-35 pounds (11-16 kg) by the end of the pregnancy.  You may begin to get stretch marks on your hips, abdomen, and breasts.  You may urinate more often because the fetus is moving lower into your pelvis and pressing on your bladder.  You may develop or continue to have heartburn as a result of your pregnancy.  You may develop constipation because certain hormones are causing the muscles that push waste through your intestines to slow down.  You may develop hemorrhoids or swollen, bulging veins (varicose veins).  You may have pelvic pain because of the weight gain and pregnancy hormones relaxing your joints between the bones in your pelvis. Backaches may result from overexertion of the muscles supporting your posture.  You may have changes in your hair. These can include thickening of your hair, rapid growth, and changes in texture. Some women also have hair loss during or after pregnancy, or hair that feels dry or thin. Your hair will most likely return to normal after your baby is born.  Your breasts will continue to grow and be tender. A yellow discharge may leak from your breasts called colostrum.  Your belly button may stick out.  You may feel short of breath because of your expanding uterus.  You may notice the fetus "dropping," or moving lower in your abdomen.  You may have a bloody mucus discharge. This usually occurs a few days to a week before labor begins.  Your cervix becomes thin and soft (effaced) near your due date. WHAT TO EXPECT AT YOUR PRENATAL  EXAMS  You will have prenatal exams every 2 weeks until week 36. Then, you will have weekly prenatal exams. During a routine prenatal visit:  You will be weighed to make sure you and the fetus are growing normally.  Your blood pressure is taken.  Your abdomen will be measured to track your baby's growth.  The fetal heartbeat will be listened to.  Any test results from the previous visit will be discussed.  You may have a cervical check near your due date to see if you have effaced. At around 36 weeks, your caregiver will check your cervix. At the same time, your caregiver will also perform a test on the secretions of the vaginal tissue. This test is to determine if a type of bacteria, Group B streptococcus, is present. Your caregiver will explain this further. Your caregiver may ask you:  What your birth plan is.  How you are feeling.  If you are feeling the baby move.  If you have had any abnormal symptoms, such as leaking fluid, bleeding, severe headaches, or abdominal cramping.  If you have any questions. Other tests or screenings that may be performed during your third trimester include:  Blood tests that check for low iron levels (anemia).  Fetal testing to check the health, activity level, and growth of the fetus. Testing is done if you have certain medical conditions or if there are problems during the pregnancy. FALSE LABOR You may feel small, irregular contractions that   eventually go away. These are called Braxton Hicks contractions, or false labor. Contractions may last for hours, days, or even weeks before true labor sets in. If contractions come at regular intervals, intensify, or become painful, it is best to be seen by your caregiver.  SIGNS OF LABOR   Menstrual-like cramps.  Contractions that are 5 minutes apart or less.  Contractions that start on the top of the uterus and spread down to the lower abdomen and back.  A sense of increased pelvic pressure or back  pain.  A watery or bloody mucus discharge that comes from the vagina. If you have any of these signs before the 37th week of pregnancy, call your caregiver right away. You need to go to the hospital to get checked immediately. HOME CARE INSTRUCTIONS   Avoid all smoking, herbs, alcohol, and unprescribed drugs. These chemicals affect the formation and growth of the baby.  Follow your caregiver's instructions regarding medicine use. There are medicines that are either safe or unsafe to take during pregnancy.  Exercise only as directed by your caregiver. Experiencing uterine cramps is a good sign to stop exercising.  Continue to eat regular, healthy meals.  Wear a good support bra for breast tenderness.  Do not use hot tubs, steam rooms, or saunas.  Wear your seat belt at all times when driving.  Avoid raw meat, uncooked cheese, cat litter boxes, and soil used by cats. These carry germs that can cause birth defects in the baby.  Take your prenatal vitamins.  Try taking a stool softener (if your caregiver approves) if you develop constipation. Eat more high-fiber foods, such as fresh vegetables or fruit and whole grains. Drink plenty of fluids to keep your urine clear or pale yellow.  Take warm sitz baths to soothe any pain or discomfort caused by hemorrhoids. Use hemorrhoid cream if your caregiver approves.  If you develop varicose veins, wear support hose. Elevate your feet for 15 minutes, 3-4 times a day. Limit salt in your diet.  Avoid heavy lifting, wear low heal shoes, and practice good posture.  Rest a lot with your legs elevated if you have leg cramps or low back pain.  Visit your dentist if you have not gone during your pregnancy. Use a soft toothbrush to brush your teeth and be gentle when you floss.  A sexual relationship may be continued unless your caregiver directs you otherwise.  Do not travel far distances unless it is absolutely necessary and only with the approval  of your caregiver.  Take prenatal classes to understand, practice, and ask questions about the labor and delivery.  Make a trial run to the hospital.  Pack your hospital bag.  Prepare the baby's nursery.  Continue to go to all your prenatal visits as directed by your caregiver. SEEK MEDICAL CARE IF:  You are unsure if you are in labor or if your water has broken.  You have dizziness.  You have mild pelvic cramps, pelvic pressure, or nagging pain in your abdominal area.  You have persistent nausea, vomiting, or diarrhea.  You have a bad smelling vaginal discharge.  You have pain with urination. SEEK IMMEDIATE MEDICAL CARE IF:   You have a fever.  You are leaking fluid from your vagina.  You have spotting or bleeding from your vagina.  You have severe abdominal cramping or pain.  You have rapid weight loss or gain.  You have shortness of breath with chest pain.  You notice sudden or extreme swelling   of your face, hands, ankles, feet, or legs.  You have not felt your baby move in over an hour.  You have severe headaches that do not go away with medicine.  You have vision changes. Document Released: 02/06/2001 Document Revised: 02/17/2013 Document Reviewed: 04/15/2012 [employer] Patient Information 2015 [employer]. This information is not intended to replace advice given to you by your health care provider. Make sure you discuss any questions you have with your health care provider.  

## 2014-09-21 NOTE — Progress Notes (Signed)
Evaluation and management procedures were performed by MS-3 under my supervision/collaboration. Chart reviewed, patient examined by me and I agree with management and plan.

## 2014-09-22 ENCOUNTER — Telehealth: Payer: Self-pay | Admitting: *Deleted

## 2014-09-22 DIAGNOSIS — O09293 Supervision of pregnancy with other poor reproductive or obstetric history, third trimester: Secondary | ICD-10-CM

## 2014-09-22 DIAGNOSIS — O09299 Supervision of pregnancy with other poor reproductive or obstetric history, unspecified trimester: Secondary | ICD-10-CM | POA: Insufficient documentation

## 2014-09-22 NOTE — Telephone Encounter (Addendum)
Pt left message stating that she is having contractions and pain. She requests a call back from a nurse. I spoke w/pt and she stated that she wants to be induced because she is having so much pelvic pressure and contractions since her membranes were swept 2 days ago. She is also concerned that she is having another large baby and does not want another 10 lb baby. She also does not want to have a C/S because the baby gets too big. I advised pt that she can discuss her concerns with the doctor at her visit on 8/1 but that right now she will not be scheduled for induction of labor because she is 38.[redacted] wks gestation. Pt again expressed concern for having an infant with macrosomia and stated that she has not had ultrasound for growth in several months. I offered to schedule ultrasound this week so that when she sees the doctor on 8/1, that information can be factored into her plan of care. Pt agreed and appt was scheduled on 7/29 @ 0930.  Pt was reminded of sx of labor and all reasons to come to hospital for evaluation. Pt voiced understanding of all information given.

## 2014-09-24 ENCOUNTER — Ambulatory Visit (HOSPITAL_COMMUNITY)
Admission: RE | Admit: 2014-09-24 | Discharge: 2014-09-24 | Disposition: A | Discharge status: Home / Self Care | Payer: Medicaid Other | Source: Ambulatory Visit | Attending: Obstetrics & Gynecology | Admitting: Obstetrics & Gynecology

## 2014-09-24 DIAGNOSIS — O3663X0 Maternal care for excessive fetal growth, third trimester, not applicable or unspecified: Secondary | ICD-10-CM | POA: Insufficient documentation

## 2014-09-24 DIAGNOSIS — O09293 Supervision of pregnancy with other poor reproductive or obstetric history, third trimester: Secondary | ICD-10-CM

## 2014-09-24 DIAGNOSIS — Z3A38 38 weeks gestation of pregnancy: Secondary | ICD-10-CM | POA: Insufficient documentation

## 2014-09-25 ENCOUNTER — Encounter (HOSPITAL_COMMUNITY): Payer: Self-pay | Admitting: *Deleted

## 2014-09-25 ENCOUNTER — Inpatient Hospital Stay (HOSPITAL_COMMUNITY)
Admission: AD | Admit: 2014-09-25 | Discharge: 2014-09-27 | DRG: 767 | Disposition: A | Discharge status: Home / Self Care | Payer: Medicaid Other | Source: Ambulatory Visit | Attending: Family Medicine | Admitting: Family Medicine

## 2014-09-25 ENCOUNTER — Inpatient Hospital Stay (HOSPITAL_COMMUNITY): Payer: Medicaid Other | Admitting: Anesthesiology

## 2014-09-25 DIAGNOSIS — O9842 Viral hepatitis complicating childbirth: Secondary | ICD-10-CM | POA: Diagnosis present

## 2014-09-25 DIAGNOSIS — O0943 Supervision of pregnancy with grand multiparity, third trimester: Secondary | ICD-10-CM

## 2014-09-25 DIAGNOSIS — Z2251 Carrier of viral hepatitis B: Secondary | ICD-10-CM

## 2014-09-25 DIAGNOSIS — Z3A38 38 weeks gestation of pregnancy: Secondary | ICD-10-CM | POA: Diagnosis present

## 2014-09-25 DIAGNOSIS — B181 Chronic viral hepatitis B without delta-agent: Secondary | ICD-10-CM | POA: Diagnosis present

## 2014-09-25 DIAGNOSIS — Z302 Encounter for sterilization: Secondary | ICD-10-CM | POA: Diagnosis not present

## 2014-09-25 DIAGNOSIS — O99834 Other infection carrier state complicating childbirth: Secondary | ICD-10-CM | POA: Diagnosis not present

## 2014-09-25 LAB — CBC
HEMATOCRIT: 35.8 % — AB (ref 36.0–46.0)
HEMOGLOBIN: 12 g/dL (ref 12.0–15.0)
MCH: 27.2 pg (ref 26.0–34.0)
MCHC: 33.5 g/dL (ref 30.0–36.0)
MCV: 81.2 fL (ref 78.0–100.0)
Platelets: 179 10*3/uL (ref 150–400)
RBC: 4.41 MIL/uL (ref 3.87–5.11)
RDW: 14.9 % (ref 11.5–15.5)
WBC: 8.8 10*3/uL (ref 4.0–10.5)

## 2014-09-25 LAB — TYPE AND SCREEN
ABO/RH(D): B POS
Antibody Screen: NEGATIVE

## 2014-09-25 LAB — RPR: RPR: NONREACTIVE

## 2014-09-25 MED ORDER — LACTATED RINGERS IV SOLN
INTRAVENOUS | Status: DC
  Administered 2014-09-25 (×3): via INTRAVENOUS

## 2014-09-25 MED ORDER — CITRIC ACID-SODIUM CITRATE 334-500 MG/5ML PO SOLN: 30.0000 mL | ORAL | Status: DC | PRN

## 2014-09-25 MED ORDER — OXYTOCIN BOLUS FROM INFUSION
500.0000 mL | INTRAVENOUS | Status: DC
  Administered 2014-09-25: 500 mL via INTRAVENOUS

## 2014-09-25 MED ORDER — OXYTOCIN 40 UNITS IN LACTATED RINGERS INFUSION - SIMPLE MED
1.0000 m[IU]/min | INTRAVENOUS | Status: DC
  Administered 2014-09-25: 20 m[IU]/min via INTRAVENOUS
  Administered 2014-09-25: 2 m[IU]/min via INTRAVENOUS
  Administered 2014-09-25: 18 m[IU]/min via INTRAVENOUS
  Filled 2014-09-25: qty 1000

## 2014-09-25 MED ORDER — OXYCODONE-ACETAMINOPHEN 5-325 MG PO TABS: 1.0000 | ORAL_TABLET | ORAL | Status: DC | PRN

## 2014-09-25 MED ORDER — OXYCODONE-ACETAMINOPHEN 5-325 MG PO TABS
1.0000 | ORAL_TABLET | ORAL | Status: DC | PRN
Start: 2014-09-25 — End: 2014-09-27
  Administered 2014-09-26 (×4): 1 via ORAL
  Filled 2014-09-25 (×4): qty 1

## 2014-09-25 MED ORDER — TERBUTALINE SULFATE 1 MG/ML IJ SOLN
0.2500 mg | Freq: Once | INTRAMUSCULAR | Status: DC | PRN
Start: 2014-09-25 — End: 2014-09-25
  Filled 2014-09-25: qty 1

## 2014-09-25 MED ORDER — EPHEDRINE 5 MG/ML INJ
10.0000 mg | INTRAVENOUS | Status: DC | PRN
  Filled 2014-09-25: qty 2

## 2014-09-25 MED ORDER — ACETAMINOPHEN 325 MG PO TABS: 650.0000 mg | ORAL_TABLET | ORAL | Status: DC | PRN

## 2014-09-25 MED ORDER — FENTANYL 2.5 MCG/ML BUPIVACAINE 1/10 % EPIDURAL INFUSION (WH - ANES)
14.0000 mL/h | INTRAMUSCULAR | Status: DC | PRN
  Administered 2014-09-25 (×2): 14 mL/h via EPIDURAL
  Filled 2014-09-25: qty 125

## 2014-09-25 MED ORDER — ONDANSETRON HCL 4 MG/2ML IJ SOLN: 4.0000 mg | INTRAMUSCULAR | Status: DC | PRN

## 2014-09-25 MED ORDER — ONDANSETRON HCL 4 MG/2ML IJ SOLN: 4.0000 mg | Freq: Four times a day (QID) | INTRAMUSCULAR | Status: DC | PRN

## 2014-09-25 MED ORDER — OXYCODONE-ACETAMINOPHEN 5-325 MG PO TABS: 2.0000 | ORAL_TABLET | ORAL | Status: DC | PRN

## 2014-09-25 MED ORDER — LACTATED RINGERS IV SOLN
500.0000 mL | INTRAVENOUS | Status: DC | PRN
  Administered 2014-09-25: 500 mL via INTRAVENOUS

## 2014-09-25 MED ORDER — LIDOCAINE HCL (PF) 1 % IJ SOLN
30.0000 mL | INTRAMUSCULAR | Status: DC | PRN
  Filled 2014-09-25: qty 30

## 2014-09-25 MED ORDER — BENZOCAINE-MENTHOL 20-0.5 % EX AERO
1.0000 "application " | INHALATION_SPRAY | CUTANEOUS | Status: DC | PRN
  Administered 2014-09-26: 1 via TOPICAL
  Filled 2014-09-25: qty 56

## 2014-09-25 MED ORDER — WITCH HAZEL-GLYCERIN EX PADS: 1.0000 "application " | MEDICATED_PAD | CUTANEOUS | Status: DC | PRN

## 2014-09-25 MED ORDER — IBUPROFEN 600 MG PO TABS
600.0000 mg | ORAL_TABLET | Freq: Four times a day (QID) | ORAL | Status: DC
  Administered 2014-09-25 – 2014-09-27 (×5): 600 mg via ORAL
  Filled 2014-09-25 (×5): qty 1

## 2014-09-25 MED ORDER — LIDOCAINE HCL (PF) 1 % IJ SOLN
INTRAMUSCULAR | Status: DC | PRN
  Administered 2014-09-25: 3 mL
  Administered 2014-09-25 (×2): 5 mL

## 2014-09-25 MED ORDER — ONDANSETRON HCL 4 MG PO TABS: 4.0000 mg | ORAL_TABLET | ORAL | Status: DC | PRN

## 2014-09-25 MED ORDER — ZOLPIDEM TARTRATE 5 MG PO TABS: 5.0000 mg | ORAL_TABLET | Freq: Every evening | ORAL | Status: DC | PRN

## 2014-09-25 MED ORDER — DIBUCAINE 1 % RE OINT: 1.0000 "application " | TOPICAL_OINTMENT | RECTAL | Status: DC | PRN

## 2014-09-25 MED ORDER — FLEET ENEMA 7-19 GM/118ML RE ENEM: 1.0000 | ENEMA | RECTAL | Status: DC | PRN

## 2014-09-25 MED ORDER — SIMETHICONE 80 MG PO CHEW
80.0000 mg | CHEWABLE_TABLET | ORAL | Status: DC | PRN
  Administered 2014-09-26: 80 mg via ORAL
  Filled 2014-09-25: qty 1

## 2014-09-25 MED ORDER — TETANUS-DIPHTH-ACELL PERTUSSIS 5-2.5-18.5 LF-MCG/0.5 IM SUSP: 0.5000 mL | Freq: Once | INTRAMUSCULAR | Status: DC

## 2014-09-25 MED ORDER — LANOLIN HYDROUS EX OINT: TOPICAL_OINTMENT | CUTANEOUS | Status: DC | PRN

## 2014-09-25 MED ORDER — OXYTOCIN 40 UNITS IN LACTATED RINGERS INFUSION - SIMPLE MED
62.5000 mL/h | INTRAVENOUS | Status: DC
  Administered 2014-09-25: 62.5 mL/h via INTRAVENOUS

## 2014-09-25 MED ORDER — SENNOSIDES-DOCUSATE SODIUM 8.6-50 MG PO TABS
2.0000 | ORAL_TABLET | ORAL | Status: DC
  Administered 2014-09-25 – 2014-09-27 (×2): 2 via ORAL
  Filled 2014-09-25 (×2): qty 2

## 2014-09-25 MED ORDER — DIPHENHYDRAMINE HCL 50 MG/ML IJ SOLN
12.5000 mg | INTRAMUSCULAR | Status: DC | PRN
  Administered 2014-09-25: 12.5 mg via INTRAVENOUS
  Filled 2014-09-25: qty 1

## 2014-09-25 MED ORDER — FENTANYL CITRATE (PF) 100 MCG/2ML IJ SOLN
50.0000 ug | INTRAMUSCULAR | Status: DC | PRN
  Administered 2014-09-25: 100 ug via INTRAVENOUS
  Filled 2014-09-25: qty 2

## 2014-09-25 MED ORDER — DIPHENHYDRAMINE HCL 25 MG PO CAPS: 25.0000 mg | ORAL_CAPSULE | Freq: Four times a day (QID) | ORAL | Status: DC | PRN

## 2014-09-25 MED ORDER — PHENYLEPHRINE 40 MCG/ML (10ML) SYRINGE FOR IV PUSH (FOR BLOOD PRESSURE SUPPORT)
80.0000 ug | PREFILLED_SYRINGE | INTRAVENOUS | Status: DC | PRN
  Filled 2014-09-25: qty 2
  Filled 2014-09-25: qty 20

## 2014-09-25 MED ORDER — PRENATAL MULTIVITAMIN CH
1.0000 | ORAL_TABLET | Freq: Every day | ORAL | Status: DC
  Administered 2014-09-26: 1 via ORAL
  Filled 2014-09-25: qty 1

## 2014-09-25 NOTE — Progress Notes (Signed)
Labor Progress Note  S: Pt seen and complaining of worsening pain; she states she is tired and ready for baby to come; she was given a dose of fent which helped alleviated the pain slightly; she is still wanting to wait for the epidural though  O:  BP 131/82 mmHg  Pulse 82  Temp(Src) 98.1 F (36.7 C) (Oral)  Resp 20  LMP 12/28/2013 (Exact Date) Cat FHR 140s; mod var, + acels, -decels, contractions q3-30min CVE: Dilation: 4.5 Effacement (%): 60 Cervical Position: Middle Station: -2 Presentation: Vertex Exam by:: h stone rnc   A&P: 29 y.o. [id number] [redacted]w[redacted]d pt is making minimal progress towards delivery, currently on pitocin #epidural when pt requests #check q2 #may consider pit break or AROM if not making further changes at next check  Lowanda Foster, MD 1:46 PM

## 2014-09-25 NOTE — Progress Notes (Signed)
Labor Progress Note  S: Pt conts to be in pain, used ball which helped. She does not want medications/epidural at this time.  O:  BP 131/88 mmHg  Pulse 96  Temp(Src) 98.1 F (36.7 C) (Oral)  Resp 18  LMP 12/28/2013 (Exact Date) Cat I strip CVE: Dilation: 4 Effacement (%): 60 Cervical Position: Middle Station: -2 Presentation: Vertex Exam by:: Dr. Phillips Hay CNM   A&P: 29 y.o. [phone] [redacted]w[redacted]d making good progress w/ pitocin augmentation #cont pit #IV pain prn w/ epi at pt request #expecting normal delivery #q2 hr checks  Lowanda Foster, MD 11:36 AM

## 2014-09-25 NOTE — Anesthesia Preprocedure Evaluation (Addendum)
Anesthesia Evaluation  Patient identified by MRN, date of birth, ID band Patient awake    Reviewed: Allergy & Precautions, H&P , NPO status , Patient's Chart, lab work & pertinent test results  History of Anesthesia Complications Negative for: history of anesthetic complications  Airway Mallampati: II  TM Distance: >3 FB Neck ROM: full    Dental no notable dental hx. (+) Teeth Intact   Pulmonary neg pulmonary ROS,  breath sounds clear to auscultation  Pulmonary exam normal       Cardiovascular negative cardio ROS Normal cardiovascular examRhythm:regular Rate:Normal     Neuro/Psych negative neurological ROS  negative psych ROS   GI/Hepatic negative GI ROS, Neg liver ROS,   Endo/Other  negative endocrine ROS  Renal/GU negative Renal ROS  negative genitourinary   Musculoskeletal   Abdominal   Peds  Hematology negative hematology ROS (+)   Anesthesia Other Findings   Reproductive/Obstetrics (+) Pregnancy                             Anesthesia Physical Anesthesia Plan  ASA: II  Anesthesia Plan: Epidural   Post-op Pain Management:    Induction:   Airway Management Planned:   Additional Equipment:   Intra-op Plan:   Post-operative Plan:   Informed Consent: I have reviewed the patients History and Physical, chart, labs and discussed the procedure including the risks, benefits and alternatives for the proposed anesthesia with the patient or authorized representative who has indicated his/her understanding and acceptance.     Plan Discussed with:   Anesthesia Plan Comments: (For PPTL with epidural.)       Anesthesia Quick Evaluation

## 2014-09-25 NOTE — H&P (Signed)
Paige Bates is a 29 y.o. female presenting for onset of spontaneous labor. Pregnancy significant for Hepatitis B positive and history of PPD with 2nd pregnancy  Clinic  [facility] Prenatal Labs  Dating  Blood type: --/--/B POS (07/30 0435)   Genetic Screen 1 wnl Screen:    AFP:     Quad:     NIPS: Antibody:NEG (07/30 0435)  Anatomic [place]  Rubella: 3.47 (01/28 1515)  GTT Early:               Third trimester:  RPR: NON REAC (05/19 1202)   Flu vaccine  HBsAg: POSITIVE (03/10 1421)   TDaP vaccine                                               Rhogam: HIV: NONREACTIVE (05/19 1202)   Baby Food                                               [id number] (07/18 0000)(For PCN allergy, check sensitivities)  Contraception Breast Paige Bates Pap:  Circumcision    Pediatrician  Dr. Katrinka Blazing   Support Person      Maternal Medical History:  Reason for admission: Contractions.   Contractions: Onset was 3-5 hours ago.   Frequency: regular.   Perceived severity is moderate.    Fetal activity: Perceived fetal activity is normal.   Last perceived fetal movement was within the past hour.    Prenatal complications: HEP B positive  Prenatal Complications - Diabetes: none.    OB History    Gravida Para Term Preterm AB TAB SAB Ectopic Multiple Living   5 3 3  0 1 1 0 0 0 3     Past Medical History  Diagnosis Date  . Anemia   . Preterm labor     PTL with 2nd child  . Chronic hepatitis B   . Eczema   . Headache   . Depression     no meds; PP with 2nd prg   Past Surgical History  Procedure Laterality Date  . No past surgeries    . Tooth extraction     Family History: family history is negative for Other, Asthma, Cancer, Diabetes, Hearing loss, Heart disease, Hypertension, and Stroke. Social History:  reports that she has never smoked. She has never used smokeless tobacco. She reports that she does not drink alcohol or use illicit drugs.   Prenatal Transfer Tool  Maternal Diabetes: No Genetic  Screening: Normal Maternal Ultrasounds/Referrals: Normal Fetal Ultrasounds or other Referrals:  Referred to Materal Fetal Medicine  Fetal arrythmis; resolved on last ultrasound Maternal Substance Abuse:  No Significant Maternal Medications:  None Significant Maternal Lab Results:  Lab values include: HBsAG positive Other Comments:  None  Review of Systems  Constitutional: Negative for fever.  Gastrointestinal: Positive for abdominal pain.  All other systems reviewed and are negative.   Dilation: 3 Effacement (%): 60 Station: -3 Exam by:: V Rogers RN  Blood pressure 133/78, pulse 87, temperature 98.1 F (36.7 C), temperature source Oral, resp. rate 18, last menstrual period 12/28/2013, currently breastfeeding. Maternal Exam:  Uterine Assessment: Contraction strength is moderate.  Contraction frequency is regular.   Abdomen: Patient reports no abdominal tenderness. Fetal presentation: vertex  Introitus: Normal vulva. Normal vagina.  Pelvis: adequate for delivery.   Cervix: Cervix evaluated by digital exam.     Fetal Exam Fetal Monitor Review: Mode: ultrasound.   Variability: moderate (6-25 bpm).   Pattern: accelerations present and no decelerations.    Fetal State Assessment: Category I - tracings are normal.     Physical Exam  Nursing note and vitals reviewed. Constitutional: She is oriented to person, place, and time. She appears well-developed and well-nourished. No distress.  HENT:  Head: Normocephalic.  Neck: Normal range of motion.  Cardiovascular: Normal rate and regular rhythm.   Respiratory: Effort normal and breath sounds normal. No respiratory distress.  GI: Soft. She exhibits no distension.  Genitourinary: Vagina normal and uterus normal.  Musculoskeletal: Normal range of motion.  Neurological: She is alert and oriented to person, place, and time.  Skin: Skin is warm and dry.  Psychiatric: She has a normal mood and affect. Her behavior is normal. Judgment  and thought content normal.    Prenatal labs: ABO, Rh: --/--/B POS (07/30 0435) Antibody: NEG (07/30 0435) Rubella: 3.47 (01/28 1515) RPR: NON REAC (05/19 1202)  HBsAg: POSITIVE (03/10 1421)  HIV: NONREACTIVE (05/19 1202)  GBS: Negative (07/18 0000)   Assessment/Plan: IUP @ 38+5 G5P3 Hepatitis B Positive Term labor Augment with pitocin IV pain medicine Anticipate Vaginal Delivery  Lawyer Washabaugh Grissett 09/25/2014, 7:03 AM

## 2014-09-25 NOTE — Progress Notes (Addendum)
Lori Clemmons CNM notified of pt's admission and status. Aware of ctx pattern, sve. Will admit to [facility]

## 2014-09-25 NOTE — Progress Notes (Signed)
Pt up to BR

## 2014-09-25 NOTE — Progress Notes (Signed)
Report called to [facility] in [place] [facility]. Pt may come to 167 via w/c

## 2014-09-25 NOTE — Anesthesia Procedure Notes (Signed)
Epidural Patient location during procedure: OB  Staffing Anesthesiologist: Demarqus Jocson Performed by: anesthesiologist   Preanesthetic Checklist Completed: patient identified, site marked, surgical consent, pre-op evaluation, timeout performed, IV checked, risks and benefits discussed and monitors and equipment checked  Epidural Patient position: sitting Prep: DuraPrep Patient monitoring: heart rate, continuous pulse ox and blood pressure Approach: right paramedian Location: L3-L4 Injection technique: LOR saline  Needle:  Needle type: Tuohy  Needle gauge: 17 G Needle length: 9 cm and 9 Needle insertion depth: 7 cm Catheter type: closed end flexible Catheter size: 20 Guage Catheter at skin depth: 12 cm Test dose: negative  Assessment Events: blood not aspirated, injection not painful, no injection resistance, negative IV test and no paresthesia  Additional Notes Patient identified. Risks/Benefits/Options discussed with patient including but not limited to bleeding, infection, nerve damage, paralysis, failed block, incomplete pain control, headache, blood pressure changes, nausea, vomiting, reactions to medication both or allergic, itching and postpartum back pain. Confirmed with bedside nurse the patient's most recent platelet count. Confirmed with patient that they are not currently taking any anticoagulation, have any bleeding history or any family history of bleeding disorders. Patient expressed understanding and wished to proceed. All questions were answered. Sterile technique was used throughout the entire procedure. Please see nursing notes for vital signs. Test dose was given through epidural needle and negative prior to continuing to dose epidural or start infusion. Warning signs of high block given to the patient including shortness of breath, tingling/numbness in hands, complete motor block, or any concerning symptoms with instructions to call for help. Patient was given  instructions on fall risk and not to get out of bed. All questions and concerns addressed with instructions to call with any issues.   

## 2014-09-25 NOTE — Progress Notes (Signed)
Labor Progress Note  S: Pt seen w/ RN at bedside; currently pain is well managed w/ epidural, though pt is feeling some pressure at the height of contractions; she is otherwise w/o complaint  O:  BP 120/78 mmHg  Pulse 83  Temp(Src) 98.1 F (36.7 C) (Oral)  Resp 18  SpO2 100%  LMP 12/28/2013 (Exact Date) FHR 140s; mod var; +acels; +early decels w/ contractions; conts q48min for CVE: Dilation: 5.5 Effacement (%): 60 Cervical Position: Middle Station: 0 Presentation: Vertex Exam by:: Dr Freida Busman   A&P: 29 y.o. [id number] [redacted]w[redacted]d pt w/ minimal cervical change after AROM #IUPC placed for better titration of pitocin #will monitor q2 hrs #expecting normal progression to delivery  Lowanda Foster, MD 6:48 PM

## 2014-09-25 NOTE — Progress Notes (Signed)
Labor Progress Note  S: Pt seen and complaining of pain w/ contractions; some confusion over the epidural and pt now amenable to getting one, though she wants to wait as long as possible; she does not need IV pain meds at this time as pain is tolerable to her  O:  BP 117/63 mmHg  Pulse 88  Temp(Src) 98.3 F (36.8 C) (Oral)  Resp 18  LMP 12/28/2013 (Exact Date)  FHR 130s; mod vari; +acels, -decels CVE: Dilation: 3 Effacement (%): 60 Cervical Position: Posterior Station: -3 Presentation: Vertex Exam by:: Lanice Shirts RN    A&P: 29 y.o. [id number] [redacted]w[redacted]d making good progress w/ pitocin augmentation #cont pit #IV pain prn w/ epi at pt request #expecting normal delivery #q2 hr checks  Lowanda Foster, MD 9:50 AM

## 2014-09-25 NOTE — MAU Note (Signed)
Pt reports having strong ctx on all night deneis SROM or bleeding.

## 2014-09-26 ENCOUNTER — Encounter (HOSPITAL_COMMUNITY): Payer: Self-pay

## 2014-09-26 ENCOUNTER — Encounter (HOSPITAL_COMMUNITY)
Admission: AD | Disposition: A | Discharge status: Home / Self Care | Payer: Self-pay | Source: Ambulatory Visit | Attending: Family Medicine

## 2014-09-26 DIAGNOSIS — Z302 Encounter for sterilization: Secondary | ICD-10-CM

## 2014-09-26 HISTORY — PX: TUBAL LIGATION: SHX77

## 2014-09-26 LAB — CBC
HCT: 30.1 % — ABNORMAL LOW (ref 36.0–46.0)
HEMOGLOBIN: 9.8 g/dL — AB (ref 12.0–15.0)
MCH: 26.6 pg (ref 26.0–34.0)
MCHC: 32.6 g/dL (ref 30.0–36.0)
MCV: 81.6 fL (ref 78.0–100.0)
PLATELETS: 159 10*3/uL (ref 150–400)
RBC: 3.69 MIL/uL — AB (ref 3.87–5.11)
RDW: 15.1 % (ref 11.5–15.5)
WBC: 7.5 10*3/uL (ref 4.0–10.5)

## 2014-09-26 LAB — SURGICAL PCR SCREEN
MRSA, PCR: NEGATIVE
STAPHYLOCOCCUS AUREUS: NEGATIVE

## 2014-09-26 SURGERY — LIGATION, FALLOPIAN TUBE, POSTPARTUM
Anesthesia: Epidural | Site: Abdomen | Laterality: Bilateral

## 2014-09-26 MED ORDER — LIDOCAINE-EPINEPHRINE (PF) 2 %-1:200000 IJ SOLN
INTRAMUSCULAR | Status: DC | PRN
  Administered 2014-09-26 (×2): 5 mL via EPIDURAL
  Administered 2014-09-26: 10 mL via EPIDURAL

## 2014-09-26 MED ORDER — FENTANYL CITRATE (PF) 100 MCG/2ML IJ SOLN
INTRAMUSCULAR | Status: DC | PRN
  Administered 2014-09-26 (×2): 50 ug via INTRAVENOUS

## 2014-09-26 MED ORDER — BUPIVACAINE HCL (PF) 0.25 % IJ SOLN
INTRAMUSCULAR | Status: AC
  Filled 2014-09-26: qty 30

## 2014-09-26 MED ORDER — LACTATED RINGERS IV SOLN
INTRAVENOUS | Status: DC | PRN
  Administered 2014-09-26: 09:00:00 via INTRAVENOUS

## 2014-09-26 MED ORDER — FAMOTIDINE 20 MG PO TABS
40.0000 mg | ORAL_TABLET | Freq: Once | ORAL | Status: AC
  Administered 2014-09-26: 40 mg via ORAL
  Filled 2014-09-26: qty 2

## 2014-09-26 MED ORDER — MIDAZOLAM HCL 2 MG/2ML IJ SOLN
INTRAMUSCULAR | Status: AC
  Filled 2014-09-26: qty 2

## 2014-09-26 MED ORDER — LACTATED RINGERS IV SOLN
INTRAVENOUS | Status: DC
  Administered 2014-09-26: 1 mL via INTRAVENOUS

## 2014-09-26 MED ORDER — LIDOCAINE-EPINEPHRINE (PF) 2 %-1:200000 IJ SOLN
INTRAMUSCULAR | Status: AC
  Filled 2014-09-26: qty 20

## 2014-09-26 MED ORDER — HYDROMORPHONE HCL 1 MG/ML IJ SOLN: 0.2500 mg | INTRAMUSCULAR | Status: DC | PRN

## 2014-09-26 MED ORDER — BUPIVACAINE HCL (PF) 0.25 % IJ SOLN
INTRAMUSCULAR | Status: DC | PRN
Start: 2014-09-26 — End: 2014-09-26
  Administered 2014-09-26: 5 mL
  Administered 2014-09-26: 1 mL

## 2014-09-26 MED ORDER — MIDAZOLAM HCL 2 MG/2ML IJ SOLN
INTRAMUSCULAR | Status: DC | PRN
  Administered 2014-09-26 (×2): 1 mg via INTRAVENOUS

## 2014-09-26 MED ORDER — ONDANSETRON HCL 4 MG/2ML IJ SOLN
INTRAMUSCULAR | Status: AC
  Filled 2014-09-26: qty 2

## 2014-09-26 MED ORDER — METOCLOPRAMIDE HCL 10 MG PO TABS
10.0000 mg | ORAL_TABLET | Freq: Once | ORAL | Status: AC
  Administered 2014-09-26: 10 mg via ORAL
  Filled 2014-09-26: qty 1

## 2014-09-26 MED ORDER — SODIUM BICARBONATE 8.4 % IV SOLN
INTRAVENOUS | Status: AC
  Filled 2014-09-26: qty 50

## 2014-09-26 MED ORDER — KETOROLAC TROMETHAMINE 30 MG/ML IJ SOLN
30.0000 mg | Freq: Once | INTRAMUSCULAR | Status: AC | PRN
  Administered 2014-09-26: 30 mg via INTRAVENOUS
  Filled 2014-09-26: qty 1

## 2014-09-26 MED ORDER — LACTATED RINGERS IV SOLN
INTRAVENOUS | Status: DC
  Administered 2014-09-26: 06:00:00 via INTRAVENOUS

## 2014-09-26 MED ORDER — ONDANSETRON HCL 4 MG/2ML IJ SOLN
INTRAMUSCULAR | Status: DC | PRN
  Administered 2014-09-26: 4 mg via INTRAVENOUS

## 2014-09-26 MED ORDER — MEPERIDINE HCL 25 MG/ML IJ SOLN: 6.2500 mg | INTRAMUSCULAR | Status: DC | PRN

## 2014-09-26 MED ORDER — PROMETHAZINE HCL 25 MG/ML IJ SOLN: 6.2500 mg | INTRAMUSCULAR | Status: DC | PRN

## 2014-09-26 MED ORDER — FENTANYL CITRATE (PF) 100 MCG/2ML IJ SOLN
INTRAMUSCULAR | Status: AC
  Filled 2014-09-26: qty 2

## 2014-09-26 SURGICAL SUPPLY — 22 items
BLADE SURG 11 STRL SS (BLADE) ×3 IMPLANT
CHLORAPREP W/TINT 26ML (MISCELLANEOUS) ×3 IMPLANT
CLIP FILSHIE TUBAL LIGA STRL (Clip) ×3 IMPLANT
CLOTH BEACON ORANGE TIMEOUT ST (SAFETY) ×3 IMPLANT
DRSG OPSITE POSTOP 3X4 (GAUZE/BANDAGES/DRESSINGS) ×3 IMPLANT
GLOVE BIO SURGEON STRL SZ 6.5 (GLOVE) ×2 IMPLANT
GLOVE BIO SURGEONS STRL SZ 6.5 (GLOVE) ×1
GLOVE BIOGEL PI IND STRL 7.0 (GLOVE) ×1 IMPLANT
GLOVE BIOGEL PI INDICATOR 7.0 (GLOVE) ×2
GOWN STRL REUS W/TWL LRG LVL3 (GOWN DISPOSABLE) ×6 IMPLANT
LIQUID BAND (GAUZE/BANDAGES/DRESSINGS) ×3 IMPLANT
NEEDLE HYPO 22GX1.5 SAFETY (NEEDLE) ×3 IMPLANT
NS IRRIG 1000ML POUR BTL (IV SOLUTION) ×3 IMPLANT
PACK ABDOMINAL MINOR (CUSTOM PROCEDURE TRAY) ×3 IMPLANT
SPONGE LAP 4X18 X RAY DECT (DISPOSABLE) IMPLANT
SUT VIC AB 0 CT1 27 (SUTURE) ×2
SUT VIC AB 0 CT1 27XBRD ANBCTR (SUTURE) ×1 IMPLANT
SUT VICRYL 4-0 PS2 18IN ABS (SUTURE) ×3 IMPLANT
SYR CONTROL 10ML LL (SYRINGE) ×3 IMPLANT
TOWEL OR 17X24 6PK STRL BLUE (TOWEL DISPOSABLE) ×6 IMPLANT
TRAY FOLEY BAG SILVER LF 16FR (SET/KITS/TRAYS/PACK) ×3 IMPLANT
WATER STERILE IRR 1000ML POUR (IV SOLUTION) ×3 IMPLANT

## 2014-09-26 NOTE — Anesthesia Postprocedure Evaluation (Signed)
Anesthesia Post Note  Patient: Paige Bates  Procedure(s) Performed: Procedure(s) (LRB): POST PARTUM TUBAL LIGATION (Bilateral)  Anesthesia type: Epidural  Patient location: Mother/Baby  Post pain: Pain level controlled  Post assessment: Post-op Vital signs reviewed  Last Vitals:  Filed Vitals:   09/26/14 1147  BP: 129/74  Pulse: 79  Temp: 36.7 C  Resp: 19    Post vital signs: Reviewed  Level of consciousness: awake  Complications: No apparent anesthesia complications

## 2014-09-26 NOTE — Lactation Note (Signed)
This note was copied from the chart of Boy Iqra Duffey. Lactation Consultation Note  Patient Name: Boy Ginger Leeth [id number] Date: 09/26/2014 Reason for consult: Follow-up assessment;Difficult latch Mom reports she has tried to latch baby but baby is not latching. She has not been pumping consistently, however, today she went for BTL which interrupted her day. Baby asleep at this visit. Started Mom pumping with DEBP but after few minutes she starting cramping so badly she could not continue with pump. Encouraged Mom to keep baby STS when able, if unable to pump do hand expression to encourage milk production. Try pump again on lower setting. Will give Mom shells to wear for inverted nipples. Encouraged Mom to see lactation before d/c. Call when baby is ready to eat so we can help her with getting baby to breast. Encouraged to make OP f/u to help with latch. Stressed importance of pumping to encourage milk production, prevent engorgement and protect milk supply. Mom requested information on [place], handout given.   Maternal Data    Feeding Feeding Type: Formula  LATCH Score/Interventions                      Lactation Tools Discussed/Used Tools: Pump;Nipple Dorris Carnes;Shells Nipple shield size: 24 Shell Type: Inverted Breast pump type: Manual   Consult Status Consult Status: Follow-up Date: 09/27/14 Follow-up type: In-patient    Alfred Levins 09/26/2014, 10:17 PM

## 2014-09-26 NOTE — Op Note (Signed)
Paige Bates 09/25/2014 - 09/26/2014  PREOPERATIVE DIAGNOSIS:  Multiparity, undesired fertility  POSTOPERATIVE DIAGNOSIS:  Multiparity, undesired fertility  PROCEDURE:  Postpartum Bilateral Tubal Sterilization using Filshie Clips   ANESTHESIA:  Epidural  COMPLICATIONS:  None immediate.  ESTIMATED BLOOD LOSS:  Less than 20 ml.  FLUIDS: 1000 ml LR.  URINE OUTPUT:  50 ml of clear urine.  INDICATIONS: 29 y.o. [id number]  with undesired fertility,status post vaginal delivery, desires permanent sterilization. Risks and benefits of procedure discussed with patient including permanence of method, bleeding, infection, injury to surrounding organs and need for additional procedures. Risk failure of 0.5-1% with increased risk of ectopic gestation if pregnancy occurs was also discussed with patient.   FINDINGS:  Normal uterus, tubes, and ovaries.  TECHNIQUE:  The patient was taken to the operating room where her epidural anesthesia was dosed up to surgical level and found to be adequate.  She was then placed in the dorsal supine position and prepped and draped in sterile fashion.  After an adequate timeout was performed, attention was turned to the patient's abdomen where a small transverse skin incision was made under the umbilical fold. The incision was taken down to the layer of fascia using the scalpel, and fascia was incised, and extended bilaterally using Mayo scissors. The peritoneum was entered in a sharp fashion. Attention was then turned to the patient's uterus, and left fallopian tube was identified and followed out to the fimbriated end.  A Filshie clip was placed on the left fallopian tube about 2 cm from the cornual attachment, with care given to incorporate the underlying mesosalpinx.  A similar process was carried out on the right side allowing for bilateral tubal sterilization.  Good hemostasis was noted overall.  Local analgesia was drizzled on both operative sites.The instruments were then  removed from the patient's abdomen and the fascial incision was repaired with 0 Vicryl, and the skin was closed with a 4-0 Vicryl subcuticular stitch. The patient tolerated the procedure well.  Sponge, lap, and needle counts were correct times two.  The patient was then taken to the recovery room awake and in stable condition.  Adam Phenix, MD 09/26/2014 10:25 AM

## 2014-09-26 NOTE — Progress Notes (Signed)
Post Partum Day 1 Subjective: Has been NPO since MN for BTL scheduled for 0930. Voiding well. Some dizziness, especially when standing. +flatus.  Lochia and pain wnl.    Objective: Blood pressure 119/72, pulse 83, temperature 98.2 F (36.8 C), temperature source Oral, resp. rate 20, last menstrual period 12/28/2013, SpO2 100 %, unknown if currently breastfeeding.  Physical Exam:  General: alert, cooperative and no distress Lochia: appropriate Uterine Fundus: firm Incision: n/a DVT Evaluation: No evidence of DVT seen on physical exam. Negative Homan's sign. No cords or calf tenderness. No significant calf/ankle edema.   Assessment/Plan Will check Hgb d/t dizziness Plan for BTL @ 0930 Plan for d/c tomorrow Breastfeeding Outpatient circ   LOS: 1 day   Marge Duncans 09/26/2014, 9:09 AM

## 2014-09-26 NOTE — Clinical Social Work Maternal (Signed)
CLINICAL SOCIAL WORK MATERNAL/CHILD NOTE  Patient Details  Name: Paige Bates MRN: [id number] Date of Birth: 02/23/1986  Date: 09/26/2014  Clinical Social Worker Initiating Note: Pamila Mendibles, LCSWDate/ Time Initiated: 09/26/14/1330   Child's Name: Peter Joshua Fuerte   Legal Guardian:  (Parents George and Magali Trudo)   Need for Interpreter: None   Date of Referral: 09/25/14   Reason for Referral: Other (Comment)   [facility] Source: [facility]   Address: [address] [place], [place] [address]  Phone number:  ([phone])   Household Members: Spouse, Minor Children   Natural Supports (not living in the home): Immediate Family   Professional Supports:None   Employment: (Spouse is employed)   Type of Work:     Education:     Financial Resources:Medicaid   Other Resources: WIC   Cultural/Religious Considerations Which May Impact Care: Parents from [place]  Strengths: Ability to meet basic needs , Home prepared for child    Risk Factors/Current Problems: None   Cognitive State: Alert , Able to Concentrate    Mood/Affect: Happy , Relaxed    CSW Assessment: Acknowledged order for social work consult to assess mother's hx of Depression and anxiety. Met with mother who was pleasant and receptive to social work. Parents are married have three other dependents ages 7, 6 and 2. Parents were born in [place] and have lived in the [place] for the past 9 years. MOB states that she hx of anxiety and depression but has never received formal treatment. Informed that she has always been able to manage the symptoms on her own. She also reports mild PP Depression with her 28 year old. She denies any current symptoms of depression or anxiety. She also denies hx of illicit drug use. No acute social concerns noted or reported at this time. Mother informed of social work availability.  CSW  Plan/Description:    Provided information/resources for PP Depression No further intervention required No barriers to discharge   Michaelann Gunnoe J, LCSW 09/26/2014, 4:39 PM   CLINICAL SOCIAL WORK MATERNAL/CHILD NOTE  Patient Details  Name: Paige Bates MRN: [id number] Date of Birth: October 28, 1985  Date:  09/26/2014  Clinical Social Worker Initiating Note:  Norlene Duel, LCSW Date/ Time Initiated:  09/26/14/1330     Child's Name:  Christell Faith Lucien   Legal Guardian:   (Parents Iona Beard and Greenwater Chiem)   Need for Interpreter:  None   Date of Referral:  09/25/14     Reason for Referral:  Other (Comment)   Referral Source:  [facility]   Address:  [address]  [place], [place] [address]  Phone number:   [phone])   Household Members:  Spouse, Minor Children   Natural Supports (not living in the home):  Immediate Family   Professional Supports: None   Employment:  (Spouse is employed)   Type of Work:     Education:      Pensions consultant:  [employer]   Other Resources:  [place] Considerations Which May Impact Care:  Parents from [place]:  Ability to meet basic needs , Home prepared for child    Risk Factors/Current Problems:  None   Cognitive State:  Alert , Able to Concentrate    Mood/Affect:  Happy , Relaxed    CSW Assessment:  Acknowledged order for social work consult to assess mother's hx of Depression and anxiety.   Met with mother who was pleasant and receptive to social work.     Parents are married have three other dependents ages 90, 77 and 2.  Parents were born in [place] and have lived in the [place]. for the past 9 years.   MOB states that she hx of anxiety and depression but has never received formal treatment.  Informed that she has always been able to manage the symptoms on her own.  She also reports mild PP Depression with her 28 year old.    She denies any current symptoms of depression or anxiety.   She also denies hx of illicit drug use.  No acute social concerns noted or reported at this time.     Mother informed of social work Fish farm manager.  CSW Plan/Description:      Provided information/resources for PP Depression No further intervention required No barriers to discharge   Naphtali Zywicki J, LCSW 09/26/2014, 4:39 PM

## 2014-09-26 NOTE — Progress Notes (Signed)
S/P SVD requests sterilization. The procedure and the risk of anesthesia, bleeding, infection, bowel and bladder injury, failure (1/200) and ectopic pregnancy were discussed and her questions were answered. The procedure is scheduled today.   Adam Phenix, MD 9:01 AM 09/26/2014

## 2014-09-26 NOTE — Transfer of Care (Signed)
Immediate Anesthesia Transfer of Care Note  Patient: Paige Bates  Procedure(s) Performed: Procedure(s): POST PARTUM TUBAL LIGATION (Bilateral)  Patient Location: PACU  Anesthesia Type:Epidural  Level of Consciousness: awake, alert  and oriented  Airway & Oxygen Therapy: Patient Spontanous Breathing  Post-op Assessment: Report given to RN and Post -op Vital signs reviewed and stable  Post vital signs: Reviewed and stable  Last Vitals:  Filed Vitals:   09/26/14 0805  BP: 119/72  Pulse: 83  Temp: 36.8 C  Resp: 20    Complications: No apparent anesthesia complications

## 2014-09-26 NOTE — Lactation Note (Signed)
This note was copied from the chart of Paige Bates. Lactation Consultation Note  Patient Name: Paige Bates [id number] Date: 09/26/2014 Reason for consult: Initial assessment Called by RN to see Mom asking for Lactation. Mom has large breasts with inverted nipples and wants assist with latch. Mom reports her history is that BF for a short time 1-3 months then she pumped and bottle fed with some of her children, used a nipple shield with the last baby who was able to BF for 9 months. At this visit, used hand pump to help with latch but nipples will not remain erect. Baby is unable to obtain latch. #24 nipple shield did fit on Mom and baby was able to develop a good suckling pattern for about 10 minutes minutes but no colostrum visible in nipple shield or with hand expression. Demonstrated to Mom how to use foley cup to supplement, she demonstrated back. Demonstrated using finger feeding with curved tipped syringe.  LC feels nipple shield will be a good tool for Mom but at present Mom does have some challenges applying the nipple shield well due to her large, soft breasts. Plan discussed with Mom is to try to latch using the nipple shield.  If she cannot latch or does not observe any colostrum in the nipple shield or with hand expression, then supplement baby per guidelines given by hours of age. Try to use foley cup or curved tipped syringe as demonstrated. Post pump on Preemie setting for 15 minutes every 3 hours. RN to set up DEBP.  Ask for assist with feedings. RN aware of plan. Lactation brochure left for review, advised of OP services and support group.   Maternal Data Has patient been taught Hand Expression?: Yes Does the patient have breastfeeding experience prior to this delivery?: Yes  Feeding Feeding Type: Breast Fed Nipple Type: Slow - flow Length of feed: 10 min  LATCH Score/Interventions Latch: Repeated attempts needed to sustain latch, nipple held in mouth throughout  feeding, stimulation needed to elicit sucking reflex. Intervention(s): Adjust position;Assist with latch;Breast massage;Breast compression  Audible Swallowing: None  Type of Nipple: Inverted Intervention(s): Hand pump  Comfort (Breast/Nipple): Soft / non-tender     Hold (Positioning): Assistance needed to correctly position infant at breast and maintain latch. Intervention(s): Breastfeeding basics reviewed;Support Pillows;Position options;Skin to skin  LATCH Score: 4  Lactation Tools Discussed/Used Tools: Nipple Dorris Carnes;Pump Nipple shield size: 24 Breast pump type: Manual WIC Program: Yes   Consult Status Consult Status: Follow-up Date: 09/26/14 Follow-up type: In-patient    Alfred Levins 09/26/2014, 12:21 AM

## 2014-09-27 ENCOUNTER — Encounter: Payer: Self-pay | Admitting: Obstetrics & Gynecology

## 2014-09-27 MED ORDER — IBUPROFEN 600 MG PO TABS: 600.0000 mg | ORAL_TABLET | Freq: Four times a day (QID) | ORAL | Status: AC

## 2014-09-27 MED ORDER — OXYCODONE-ACETAMINOPHEN 5-325 MG PO TABS: 1.0000 | ORAL_TABLET | ORAL | Status: AC | PRN

## 2014-09-27 MED ORDER — OXYCODONE-ACETAMINOPHEN 5-325 MG PO TABS: 1.0000 | ORAL_TABLET | ORAL | Status: DC | PRN

## 2014-09-27 NOTE — Discharge Instructions (Signed)
Postpartum Tubal Ligation °Care After °Refer to this sheet in the next few weeks. These instructions provide you with information on caring for yourself after your procedure. Your caregiver may also give you more specific instructions. Your treatment has been planned according to current medical practices, but problems sometimes occur. Call your caregiver if you have any problems or questions after your procedure. °HOME CARE INSTRUCTIONS  °· Rest the remainder of the day. °· Only take over-the-counter or prescription medicines for pain, discomfort, or fever as directed by your caregiver. Do not take aspirin. It can cause bleeding. °· Gradually resume daily activities, diet, rest, driving, and work. °· Avoid sexual intercourse for 2 weeks or as directed. °· Do not drive while taking pain medicine. °· Do not lift anything over 5 pounds for 2 weeks or as directed. °· Only take showers, not baths, until you are seen by your caregiver. °· Change bandages (dressings) as directed. °· Take your temperature twice a day and record it. °· Try to have help for the first 7-10 days for your household needs. °· Return to your caregiver to get your stitches (sutures) removed and for follow-up visits as directed. °SEEK MEDICAL CARE IF:  °· You have redness, swelling, or increasing pain in the wound. °· You have drainage from the wound lasting longer than 1 day. °· Your pain is getting worse. °· You have a rash. °· You become dizzy or lightheaded. °· You have a reaction to your medicine. °· You need stronger medicine or a change in your pain medicine. °· You notice a bad smell coming from the wound or dressing. °· Your wound breaks open after the sutures have been removed. °· You are constipated. °SEEK IMMEDIATE MEDICAL CARE IF:  °· You faint. °· You have a fever. °· You have increasing abdominal pain. °· You have severe pain in your shoulders. °· You have bleeding or drainage from the suture sites or vagina following surgery. °· You  have shortness of breath or difficulty breathing. °· You have chest or leg pain. °· You have persistent nausea, vomiting, or diarrhea. °MAKE SURE YOU:  °· Understand these instructions. °· Watch your condition. °· Get help right away if you are not doing well or get worse. °Document Released: 08/14/2011 Document Reviewed: 08/14/2011 °ExitCare® Patient Information ©2015 [employer]. This information is not intended to replace advice given to you by your health care provider. Make sure you discuss any questions you have with your health care provider. ° °

## 2014-09-27 NOTE — Anesthesia Postprocedure Evaluation (Signed)
  Anesthesia Post-op Note  Patient: Paige Bates  Procedure(s) Performed: Procedure(s): POST PARTUM TUBAL LIGATION (Bilateral)  Patient Location: Mother/Baby  Anesthesia Type:Epidural  Level of Consciousness: awake, alert  and oriented  Airway and Oxygen Therapy: Patient Spontanous Breathing  Post-op Pain: none  Post-op Assessment: Post-op Vital signs reviewed and Patient's Cardiovascular Status Stable LLE Motor Response: Purposeful movement LLE Sensation: Increased RLE Motor Response: Purposeful movement RLE Sensation: Increased      Post-op Vital Signs: Reviewed and stable  Last Vitals:  Filed Vitals:   09/27/14 0504  BP: 115/69  Pulse: 81  Temp: 36.3 C  Resp: 20    Complications: No apparent anesthesia complications

## 2014-09-27 NOTE — Addendum Note (Signed)
Addendum  created 09/27/14 0810 by Elgie Congo, CRNA   Modules edited: Notes Section   Notes Section:  File: [id number]

## 2014-09-27 NOTE — Lactation Note (Signed)
This note was copied from the chart of Paige Bates. Lactation Consultation Note  Patient Name: Paige Paige Bates [id number] Date: 09/27/2014 Reason for consult: Follow-up assessment    With this experienced breast feeding mom, and term baby, now 53 hours old. Mom has large nipples that are inverted. She was trying to use 24 nipple shield, but it was not pulling enough of her nipple into the shield to make it effective.  Mom states it is easier to latch to her right breast, but was using her left at this time. Mom is persistent, and after many attempts, is able to get the baby deeply latched to her right breast. She is determined to get this baby breast feeding without nipple shield, if possible.   Mom has an o/p lactation consult for this Friday, 8/5 at 2;30 pm. Mom has a DEP at home, and knows to pump every 3 hours or more, if baby not latchinag well, to provide eBm for baby. Mom has been cup feeding the baby, and I reviewed with her the amounts she can offer the baby (more than she has been ). Mom knows to call for questions/concerns to lactation.    Maternal Data    Feeding Feeding Type: Breast Fed  LATCH Score/Interventions Latch: Repeated attempts needed to sustain latch, nipple held in mouth throughout feeding, stimulation needed to elicit sucking reflex. Intervention(s): Breast massage;Breast compression  Audible Swallowing: A few with stimulation  Type of Nipple: Inverted  Comfort (Breast/Nipple): Soft / non-tender     Hold (Positioning): No assistance needed to correctly position infant at breast. Intervention(s): Breastfeeding basics reviewed  LATCH Score: 6  Lactation Tools Discussed/Used Nipple shield size: 24   Consult Status Consult Status: Complete Date: 10/01/14 Follow-up type: Out-patient    Alfred Levins 09/27/2014, 10:40 AM

## 2014-09-27 NOTE — Discharge Summary (Signed)
OB Discharge Summary  Patient Name: Paige Bates DOB: 16-Dec-1985 MRN: [id number]  Date of admission: 09/25/2014     Date of discharge: 09/27/2014  Admitting diagnosis: Onset of Labor   Intrauterine pregnancy: [redacted]w[redacted]d     Secondary diagnosis: None     Discharge diagnosis: Term Pregnancy Delivered  Method of delivery:      Information for the patient's newborn:  Shawnae, Leiva [[id number]]  Delivery Method: Vaginal, Spontaneous Delivery (Filed from Delivery Summary)      At 7:13 PM a viable female was delivered via Vaginal, Spontaneous Delivery (Presentation: Left Occiput Anterior). APGAR: 8, 9; weight pending.  Placenta status: Intact, Spontaneous. Cord: 3 vessels with the following complications: None.   Anesthesia: Epidural  Episiotomy: None Lacerations: None Est. Blood Loss (mL): 476                                                                                               Intrapartum Procedures:                                                          Post partum procedures:postpartum tubal ligation      Hospital course:  Onset of Labor With Vaginal Delivery     29 y.o. yo [id number] at [redacted]w[redacted]d was admitted in Active Laboron 09/25/2014. Patient had an uncomplicated labor course as follows:       Length of Stage 3:                                    Mediations and procedures used include: N/A  Patient had a delivery of a Viable infant. 09/25/2014  Information for the patient's newborn:  Jonia, Oakey [[id number]]  Delivery Method: Vaginal, Spontaneous Delivery (Filed from Delivery Summary)    Pateint had an uncomplicated postpartum course.  She is ambulating, tolerating a regular diet, passing flatus, and urinating well. Patient is discharged home in stable condition on 09/27/2014     Physical exam  Filed Vitals:   09/26/14 1844 09/26/14 1956 09/27/14 0010 09/27/14 0504  BP: 118/72 116/71 127/67 115/69  Pulse: 84 88 80 81  Temp: 98.5 F (36.9 C) 98 F  (36.7 C) 97.9 F (36.6 C) 97.4 F (36.3 C)  TempSrc: Oral Oral Oral Oral  Resp: 18 18 20 20   SpO2:       General: alert, cooperative and no distress Lochia: appropriate Uterine Fundus: firm Incision: Dressing is clean, dry, and intact DVT Evaluation: No evidence of DVT seen on physical exam. Labs: Lab Results  Component Value Date   WBC 7.5 09/26/2014   HGB 9.8* 09/26/2014   HCT 30.1* 09/26/2014   MCV 81.6 09/26/2014   PLT 159 09/26/2014   CMP Latest Ref Rng 06/22/2012  Glucose 70 - 99 mg/dL 90  BUN 6 - 23 mg/dL 4(L)  Creatinine 6.96 -  1.10 mg/dL 1.61(W)  Sodium 960 - 454 mEq/L 136  Potassium 3.5 - 5.1 mEq/L 3.5  Chloride 96 - 112 mEq/L 103  CO2 19 - 32 mEq/L 21  Calcium 8.4 - 10.5 mg/dL 8.8  Total Protein 6.0 - 8.3 g/dL 6.3  Total Bilirubin 0.3 - 1.2 mg/dL 0.9(W)  Alkaline Phos 39 - 117 U/L 145(H)  AST 0 - 37 U/L 45(H)  ALT 0 - 35 U/L 18    Discharge instruction: per After Visit Summary and "Baby and Me Booklet".  Medications:   Medication List    STOP taking these medications        pantoprazole 40 MG tablet  Commonly known as:  PROTONIX      TAKE these medications        ibuprofen 600 MG tablet  Commonly known as:  ADVIL,MOTRIN  Take 1 tablet (600 mg total) by mouth every 6 (six) hours.     oxyCODONE-acetaminophen 5-325 MG per tablet  Commonly known as:  PERCOCET/ROXICET  Take 1-2 tablets by mouth every 4 (four) hours as needed (for pain scale 4-7).     Prenatal Vitamin 27-0.8 MG Tabs  Take 1 tablet by mouth daily.        Diet: routine diet  Activity: Advance as tolerated. Pelvic rest for 6 weeks.   Outpatient follow up:6 weeks  Postpartum contraception: Tubal Ligation  Newborn Data: Live born female  Birth Weight: 7 lb 8.1 oz (3405 g) APGAR: 8, 9  Baby Feeding: Breast Disposition:home with mother   09/27/2014 Scheryl Darter, MD

## 2014-09-28 ENCOUNTER — Encounter (HOSPITAL_COMMUNITY): Payer: Self-pay | Admitting: Obstetrics & Gynecology

## 2014-09-28 NOTE — Addendum Note (Signed)
Addendum  created 09/28/14 1440 by Randa Spike, CRNA   Modules edited: Charges VN

## 2014-09-28 NOTE — Addendum Note (Signed)
Addendum  created 09/28/14 1423 by Randa Spike, CRNA   Modules edited: Charges VN

## 2014-10-01 ENCOUNTER — Inpatient Hospital Stay (HOSPITAL_COMMUNITY)
Admission: AD | Admit: 2014-10-01 | Discharge: 2014-10-03 | DRG: 776 | Disposition: A | Discharge status: Home / Self Care | Payer: Medicaid Other | Source: Ambulatory Visit | Attending: Obstetrics & Gynecology | Admitting: Obstetrics & Gynecology

## 2014-10-01 ENCOUNTER — Ambulatory Visit (HOSPITAL_COMMUNITY)
Admit: 2014-10-01 | Discharge: 2014-10-01 | Disposition: A | Discharge status: Home / Self Care | Payer: Medicaid Other | Attending: Pediatrics | Admitting: Pediatrics

## 2014-10-01 ENCOUNTER — Encounter (HOSPITAL_COMMUNITY): Payer: Self-pay | Admitting: *Deleted

## 2014-10-01 DIAGNOSIS — G8918 Other acute postprocedural pain: Secondary | ICD-10-CM | POA: Diagnosis present

## 2014-10-01 DIAGNOSIS — O9089 Other complications of the puerperium, not elsewhere classified: Principal | ICD-10-CM | POA: Diagnosis present

## 2014-10-01 DIAGNOSIS — O1410 Severe pre-eclampsia, unspecified trimester: Secondary | ICD-10-CM | POA: Diagnosis not present

## 2014-10-01 DIAGNOSIS — F419 Anxiety disorder, unspecified: Secondary | ICD-10-CM | POA: Diagnosis present

## 2014-10-01 DIAGNOSIS — I158 Other secondary hypertension: Secondary | ICD-10-CM | POA: Diagnosis present

## 2014-10-01 DIAGNOSIS — O1490 Unspecified pre-eclampsia, unspecified trimester: Secondary | ICD-10-CM

## 2014-10-01 DIAGNOSIS — O99345 Other mental disorders complicating the puerperium: Secondary | ICD-10-CM | POA: Diagnosis present

## 2014-10-01 DIAGNOSIS — O1415 Severe pre-eclampsia, complicating the puerperium: Secondary | ICD-10-CM | POA: Diagnosis present

## 2014-10-01 DIAGNOSIS — R109 Unspecified abdominal pain: Secondary | ICD-10-CM | POA: Diagnosis present

## 2014-10-01 LAB — PROTEIN / CREATININE RATIO, URINE
CREATININE, URINE: 54 mg/dL
Total Protein, Urine: <6 mg/dL

## 2014-10-01 LAB — COMPREHENSIVE METABOLIC PANEL
ALK PHOS: 61 U/L (ref 38–126)
ALT: 32 U/L (ref 14–54)
AST: 28 U/L (ref 15–41)
Albumin: 3.4 g/dL — ABNORMAL LOW (ref 3.5–5.0)
Anion gap: 9 (ref 5–15)
BUN: 11 mg/dL (ref 6–20)
CHLORIDE: 104 mmol/L (ref 101–111)
CO2: 25 mmol/L (ref 22–32)
Calcium: 9.5 mg/dL (ref 8.9–10.3)
Creatinine, Ser: 0.62 mg/dL (ref 0.44–1.00)
GFR calc Af Amer: >60 mL/min (ref >60)
GFR calc non Af Amer: >60 mL/min (ref >60)
Glucose, Bld: 103 mg/dL — ABNORMAL HIGH (ref 65–99)
Potassium: 3.9 mmol/L (ref 3.5–5.1)
Sodium: 138 mmol/L (ref 135–145)
TOTAL PROTEIN: 6.4 g/dL — AB (ref 6.5–8.1)
Total Bilirubin: 0.6 mg/dL (ref 0.3–1.2)

## 2014-10-01 LAB — URINALYSIS, ROUTINE W REFLEX MICROSCOPIC
Bilirubin Urine: NEGATIVE
Glucose, UA: NEGATIVE mg/dL
Ketones, ur: NEGATIVE mg/dL
LEUKOCYTES UA: NEGATIVE
Nitrite: NEGATIVE
PROTEIN: NEGATIVE mg/dL
Specific Gravity, Urine: <1.005 — ABNORMAL LOW (ref 1.005–1.030)
Urobilinogen, UA: 0.2 mg/dL (ref 0.0–1.0)
pH: 6 (ref 5.0–8.0)

## 2014-10-01 LAB — CBC
HCT: 31.8 % — ABNORMAL LOW (ref 36.0–46.0)
Hemoglobin: 10.4 g/dL — ABNORMAL LOW (ref 12.0–15.0)
MCH: 26.7 pg (ref 26.0–34.0)
MCHC: 32.7 g/dL (ref 30.0–36.0)
MCV: 81.5 fL (ref 78.0–100.0)
PLATELETS: 230 10*3/uL (ref 150–400)
RBC: 3.9 MIL/uL (ref 3.87–5.11)
RDW: 14.8 % (ref 11.5–15.5)
WBC: 5.6 10*3/uL (ref 4.0–10.5)

## 2014-10-01 LAB — URINE MICROSCOPIC-ADD ON: WBC, UA: NONE SEEN WBC/hpf (ref <3)

## 2014-10-01 MED ORDER — HYDRALAZINE HCL 20 MG/ML IJ SOLN: 10.0000 mg | Freq: Once | INTRAMUSCULAR | Status: DC | PRN

## 2014-10-01 MED ORDER — LABETALOL HCL 5 MG/ML IV SOLN
20.0000 mg | INTRAVENOUS | Status: DC | PRN
Start: 2014-10-01 — End: 2014-10-03
  Administered 2014-10-01: 20 mg via INTRAVENOUS
  Filled 2014-10-01: qty 4

## 2014-10-01 MED ORDER — OXYCODONE-ACETAMINOPHEN 5-325 MG PO TABS
1.0000 | ORAL_TABLET | ORAL | Status: DC | PRN
  Administered 2014-10-02 (×3): 2 via ORAL
  Filled 2014-10-01 (×3): qty 2

## 2014-10-01 MED ORDER — ONDANSETRON HCL 4 MG/2ML IJ SOLN: 4.0000 mg | Freq: Four times a day (QID) | INTRAMUSCULAR | Status: DC | PRN

## 2014-10-01 MED ORDER — ALUM & MAG HYDROXIDE-SIMETH 200-200-20 MG/5ML PO SUSP
30.0000 mL | ORAL | Status: DC | PRN
  Filled 2014-10-01: qty 30

## 2014-10-01 MED ORDER — PRENATAL MULTIVITAMIN CH
1.0000 | ORAL_TABLET | Freq: Every day | ORAL | Status: DC
  Administered 2014-10-02: 1 via ORAL
  Filled 2014-10-01: qty 1

## 2014-10-01 MED ORDER — TEMAZEPAM 15 MG PO CAPS: 15.0000 mg | ORAL_CAPSULE | Freq: Every evening | ORAL | Status: DC | PRN

## 2014-10-01 MED ORDER — HYDRALAZINE HCL 20 MG/ML IJ SOLN: 10.0000 mg | Freq: Once | INTRAMUSCULAR | Status: AC | PRN

## 2014-10-01 MED ORDER — ACETAMINOPHEN 500 MG PO TABS: 1000.0000 mg | ORAL_TABLET | Freq: Four times a day (QID) | ORAL | Status: DC | PRN

## 2014-10-01 MED ORDER — MAGNESIUM SULFATE 50 % IJ SOLN
2.0000 g/h | INTRAVENOUS | Status: DC
  Administered 2014-10-01 – 2014-10-02 (×2): 2 g/h via INTRAVENOUS
  Filled 2014-10-01 (×2): qty 80

## 2014-10-01 MED ORDER — LACTATED RINGERS IV SOLN
INTRAVENOUS | Status: DC
  Administered 2014-10-01 – 2014-10-02 (×3): via INTRAVENOUS

## 2014-10-01 MED ORDER — IBUPROFEN 600 MG PO TABS
600.0000 mg | ORAL_TABLET | Freq: Four times a day (QID) | ORAL | Status: DC
  Administered 2014-10-01 – 2014-10-03 (×6): 600 mg via ORAL
  Filled 2014-10-01 (×6): qty 1

## 2014-10-01 MED ORDER — ONDANSETRON HCL 4 MG PO TABS: 4.0000 mg | ORAL_TABLET | Freq: Four times a day (QID) | ORAL | Status: DC | PRN

## 2014-10-01 MED ORDER — MAGNESIUM HYDROXIDE 400 MG/5ML PO SUSP
30.0000 mL | Freq: Every day | ORAL | Status: DC | PRN
  Filled 2014-10-01: qty 30

## 2014-10-01 MED ORDER — LABETALOL HCL 5 MG/ML IV SOLN
20.0000 mg | INTRAVENOUS | Status: DC | PRN
  Administered 2014-10-01: 20 mg via INTRAVENOUS
  Filled 2014-10-01: qty 4

## 2014-10-01 MED ORDER — MAGNESIUM SULFATE BOLUS VIA INFUSION
6.0000 g | Freq: Once | INTRAVENOUS | Status: AC
  Administered 2014-10-01: 6 g via INTRAVENOUS
  Filled 2014-10-01: qty 500

## 2014-10-01 MED ORDER — DOCUSATE SODIUM 100 MG PO CAPS
100.0000 mg | ORAL_CAPSULE | Freq: Two times a day (BID) | ORAL | Status: DC
  Administered 2014-10-02: 100 mg via ORAL
  Filled 2014-10-01 (×2): qty 1

## 2014-10-01 NOTE — Lactation Note (Signed)
Lactation Consult   Mother's reason for visit:  Inverted left nipple Visit Type:  OP Appointment Notes:  Paige Bates has large nipple with inversion on the left. Paige Bates is attaching with a NS.  Mom's breasts are large and very full.  She reports that her milk just came to volume 2 days ago.  Baby latches to breast but needs stimulation to continue suckling.  I encouraged mom to use breast compression to stay interested.  He transferred 4 ml.  He was repositioned in a cross cradle hold. Mom reported pain but was able to adjust his latch obtain a pain-free latch. He needed stimulation to continue suckling. When he came off of the breast the upper edge of the nipple tip was compressed.  He did not have the entire nipple in his mouth and he did not transfer any additional milk.  He does not open his mouth widely for feedings and he humps his tongue so that the nipple/ or a gloved finger cannot advance deeply.  Mom has not been able to express milk with borrowed pump in style.  A symphony was used and she expressed 2 oz.  Paige Bates had already been fed 1 oz of formula so he was offered 1 oz of EBM .which he easily took. He then fell asleep.  A WIC loaner was obtained by Mom.  She will pump at least 8 times in 24 hours and feed any amount back to Paige Bates.  Mom is going to BF every 3 hours or sooner if necessary and follow each feeding with 2 oz of BM of formula. Pump for 15 minutes after each BF Weight check on Tuesday    Consult:  Initial Lactation Consultant:  Paige Bates  ________________________________________________________________________ Paige Bates Name: Paige Bates Date of Birth: 09/25/2013 Pediatrician: Paige Bates Gender: female Gestational Age: [redacted]w[redacted]d (At Birth) Birth Weight: 7 lb 8.1 oz (3405 g) Weight at Discharge: Weight: 7 lb 1.9 oz (3230 g)Date of Discharge: 09/27/2014 [facility] Weights   09/25/14 1913 09/27/14 0026  Weight: 7 lb 8.1 oz (3405 g) 7 lb 1.9 oz  (3230 g)   [facility]: 7# Location:Pediatrician's office 09/28/14 Weight today: 7# 0.8 3198 g Plan is to supplement with 2 oz after BF      ________________________________________________________________________  Mother's Name: Paige Bates Type of delivery:  Vaginal Breastfeeding Experience:  1st child 3 mos, 2nd, 1 month, 3rd 9 months Maternal Medical Conditions:  na Maternal Medications:  Percocet, ibuprofen, PNV  ________________________________________________________________________  Breastfeeding History (Post Discharge)  Frequency of breastfeeding:  Every 3-4 hours Duration of feeding:  30 -60 minutes  Infant Intake and Output Assessment  Voids:  6 + in 24 hrs.  Color:   Stools:  4 in 24 hrs.  Color:    ________________________________________________________________________

## 2014-10-01 NOTE — MAU Note (Signed)
Patient forgot to obtain urine specimen while urinating. Explained to pt that a specimen could be collected with her next void.

## 2014-10-01 NOTE — MAU Note (Signed)
Report to [facility] RN.  Patient to be admitted to room 373.

## 2014-10-01 NOTE — MAU Provider Note (Signed)
Chief Complaint: Abdominal Pain   First Provider Initiated Contact with Patient 10/01/14 2056      SUBJECTIVE HPI: Paige Bates is a 29 y.o. [id number] on PPD#6 and POD#5 following NSVD and BTL who presents to maternity admissions reporting bilateral abdominal stretching/pulling pain when walking.  This pain started yesterday.  She denies pain when sitting down or not moving but reports the pain with walking or bending is so severe it takes her breath away.  She is taking ibuprofen and Percocet which are not helping.  She reports onset of a constant frontal dull headache in MAU that is worsening. She denies epigastric pain or visual disturbances. She reports light lochia, denies vaginal itching/burning, urinary symptoms, dizziness, n/v, or fever/chills.     HPI  Past Medical History  Diagnosis Date  . Anemia   . Preterm labor     PTL with 2nd child  . Chronic hepatitis B   . Eczema   . Headache   . Depression     no meds; PP with 2nd prg   Past Surgical History  Procedure Laterality Date  . Tooth extraction    . Tubal ligation Bilateral 09/26/2014    Procedure: POST PARTUM TUBAL LIGATION;  Surgeon: Adam Phenix, MD;  Location: [facility];  Service: Gynecology;  Laterality: Bilateral;   History   Social History  . Marital Status: Married    Spouse Name: N/A  . Number of Children: N/A  . Years of Education: N/A   Occupational History  . Not on file.   Social History Main Topics  . Smoking status: Never Smoker   . Smokeless tobacco: Never Used  . Alcohol Use: No  . Drug Use: No  . Sexual Activity: Yes    Birth Control/ Protection: None   Other Topics Concern  . Not on file   Social History Narrative   Current Facility-Administered Medications on File Prior to Encounter  Medication Dose Route Frequency Provider Last Rate Last Dose  . tuberculin injection 5 Units  5 Units Intradermal Once Reva Bores, MD       Current Outpatient Prescriptions on File Prior to  Encounter  Medication Sig Dispense Refill  . ibuprofen (ADVIL,MOTRIN) 600 MG tablet Take 1 tablet (600 mg total) by mouth every 6 (six) hours. 30 tablet 0  . oxyCODONE-acetaminophen (PERCOCET/ROXICET) 5-325 MG per tablet Take 1-2 tablets by mouth every 4 (four) hours as needed for severe pain. 10 tablet 0  . Prenatal Vit-Fe Fumarate-FA (PRENATAL VITAMIN) 27-0.8 MG TABS Take 1 tablet by mouth daily. 30 tablet 11   Allergies  Allergen Reactions  . Latex Itching and Rash    Review of Systems  Constitutional: Negative for fever, chills and malaise/fatigue.  Eyes: Negative for blurred vision.  Respiratory: Negative for cough and shortness of breath.   Cardiovascular: Negative for chest pain.  Gastrointestinal: Positive for abdominal pain. Negative for heartburn, nausea, vomiting, diarrhea and constipation.  Genitourinary: Negative for dysuria, urgency and frequency.  Musculoskeletal: Negative.   Neurological: Positive for headaches. Negative for dizziness.  Psychiatric/Behavioral: Negative for depression.    OBJECTIVE Blood pressure 159/85, pulse 57, temperature 98.4 F (36.9 C), temperature source Oral, resp. rate 20, height  (1.626 m), weight 104.327 kg (230 lb), last menstrual period 12/28/2013, SpO2 100 %, currently breastfeeding.   Patient Vitals for the past 24 hrs:  BP Temp Temp src Pulse Resp SpO2 Height Weight  10/01/14 2146 167/94 mmHg - - 66 - - - -  10/01/14 2131 (!) 181/101 mmHg - - (!) 54 - - - -  10/01/14 2116 168/91 mmHg - - (!) 56 - - - -  10/01/14 2101 160/94 mmHg - - (!) 54 - - - -  10/01/14 2046 112/90 mmHg - - (!) 54 - - - -  10/01/14 2031 158/91 mmHg - - 63 - - - -  10/01/14 2029 152/87 mmHg - - (!) 59 - - - -  10/01/14 2013 150/94 mmHg 98.5 F (36.9 C) - 60 18 - 5\' 4"  (1.626 m) 104.327 kg (230 lb)   GENERAL: Well-developed, well-nourished female in no acute distress.  EYES: normal sclera/conjunctiva; no lid-lag HENT: Atraumatic, normocephalic HEART:  RRR, no murmurs rubs/gallops RESP: clear and equal to auscultation bilaterally in all lobes ABDOMEN: Soft, non-tender MUSCULOSKELETAL: Normal ROM EXTREMITIES: Nontender, no edema NEURO/PSYCH: Alert and oriented, appropriate affect   LAB RESULTS Results for orders placed or performed during the hospital encounter of 10/01/14 (from the past 24 hour(s))  CBC     Status: Abnormal   Collection Time: 10/01/14  8:40 PM  Result Value Ref Range   WBC 5.6 4.0 - 10.5 K/uL   RBC 3.90 3.87 - 5.11 MIL/uL   Hemoglobin 10.4 (L) 12.0 - 15.0 g/dL   HCT 45.4 (L) 09.8 - 11.9 %   MCV 81.5 78.0 - 100.0 fL   MCH 26.7 26.0 - 34.0 pg   MCHC 32.7 30.0 - 36.0 g/dL   RDW 14.7 82.9 - 56.2 %   Platelets 230 150 - 400 K/uL  Comprehensive metabolic panel     Status: Abnormal   Collection Time: 10/01/14  8:40 PM  Result Value Ref Range   Sodium 138 135 - 145 mmol/L   Potassium 3.9 3.5 - 5.1 mmol/L   Chloride 104 101 - 111 mmol/L   CO2 25 22 - 32 mmol/L   Glucose, Bld 103 (H) 65 - 99 mg/dL   BUN 11 6 - 20 mg/dL   Creatinine, Ser 1.30 0.44 - 1.00 mg/dL   Calcium 9.5 8.9 - 86.5 mg/dL   Total Protein 6.4 (L) 6.5 - 8.1 g/dL   Albumin 3.4 (L) 3.5 - 5.0 g/dL   AST 28 15 - 41 U/L   ALT 32 14 - 54 U/L   Alkaline Phosphatase 61 38 - 126 U/L   Total Bilirubin 0.6 0.3 - 1.2 mg/dL   GFR calc non Af Amer >60 >60 mL/min   GFR calc Af Amer >60 >60 mL/min   Anion gap 9 5 - 15  Urinalysis, Routine w reflex microscopic (not at [facility])     Status: Abnormal   Collection Time: 10/01/14  9:19 PM  Result Value Ref Range   Color, Urine YELLOW YELLOW   APPearance CLEAR CLEAR   Specific Gravity, Urine <1.005 (L) 1.005 - 1.030   pH 6.0 5.0 - 8.0   Glucose, UA NEGATIVE NEGATIVE mg/dL   Hgb urine dipstick SMALL (A) NEGATIVE   Bilirubin Urine NEGATIVE NEGATIVE   Ketones, ur NEGATIVE NEGATIVE mg/dL   Protein, ur NEGATIVE NEGATIVE mg/dL   Urobilinogen, UA 0.2 0.0 - 1.0 mg/dL   Nitrite NEGATIVE NEGATIVE   Leukocytes, UA NEGATIVE  NEGATIVE  Protein / creatinine ratio, urine     Status: None   Collection Time: 10/01/14  9:19 PM  Result Value Ref Range   Creatinine, Urine 54.00 mg/dL   Total Protein, Urine <6 mg/dL   Protein Creatinine Ratio        0.00 - 0.15  mg/mg[Cre]  Urine microscopic-add on     Status: Abnormal   Collection Time: 10/01/14  9:19 PM  Result Value Ref Range   Squamous Epithelial / LPF RARE RARE   WBC, UA  <3 WBC/hpf    NO FORMED ELEMENTS SEEN ON URINE MICROSCOPIC EXAMINATION   RBC / HPF 3-6 <3 RBC/hpf   Bacteria, UA FEW (A) RARE    --/--/B POS (07/30 0435)   ASSESSMENT 1. Preeclampsia in postpartum period, unspecified trimester   2.  Postop abdominal pain  PLAN Preeclampsia protocol with labetalol orders started in MAU r/t severe range BPs Consult Dr Macon Large, reviewed assessment and labs Admit to AICU for magnesium sulfate  Discussed admission with pt, may room in with infant if another adult present Breast pump provided for pt while in MAU     Medication List    ASK your doctor about these medications        ibuprofen 600 MG tablet  Commonly known as:  ADVIL,MOTRIN  Take 1 tablet (600 mg total) by mouth every 6 (six) hours.     oxyCODONE-acetaminophen 5-325 MG per tablet  Commonly known as:  PERCOCET/ROXICET  Take 1-2 tablets by mouth every 4 (four) hours as needed for severe pain.     Prenatal Vitamin 27-0.8 MG Tabs  Take 1 tablet by mouth daily.         Sharen Counter Certified Nurse-Midwife 10/02/2014  1:17 AM

## 2014-10-01 NOTE — MAU Note (Signed)
SVD last Sat and then had BTL Sunday am. Since Thurs feel "pulling" in lower abd when get up, walk, move. OK when sitting still. Feel dizzy. No headaches. Feels some fld in R foot

## 2014-10-02 ENCOUNTER — Inpatient Hospital Stay (HOSPITAL_COMMUNITY): Payer: Medicaid Other

## 2014-10-02 LAB — MRSA PCR SCREENING: MRSA by PCR: NEGATIVE

## 2014-10-02 MED ORDER — LORAZEPAM 2 MG/ML IJ SOLN
0.5000 mg | Freq: Once | INTRAMUSCULAR | Status: AC
  Administered 2014-10-02: 0.5 mg via INTRAVENOUS
  Filled 2014-10-02: qty 0.25

## 2014-10-02 NOTE — Progress Notes (Signed)
Faculty Practice OB/GYN Attending Note  Subjective:  Patient reports persistent severe right sided headache, not responsive to narcotics medications+ NSAIDs. No focal neurological symptoms apart from headache. She is very concerned about her headache.  No visual symptoms.  Admitted on 10/01/2014 for Hypertension in pregnancy, preeclampsia, severe, postpartum condition.   Objective:  Blood pressure 133/82, pulse 68, temperature 97.6 F (36.4 C), temperature source Oral, resp. rate 18, height  (1.626 m), weight 226 lb 11.2 oz (102.83 kg), last menstrual period 12/28/2013, SpO2 99 %, currently breastfeeding. Gen: NAD Neuro: Normal movement in all extremities, normal speech, normal sensation on face and extremiteis HENT: Normocephalic, atraumatic Lungs: Normal respiratory effort Heart: Regular rate noted Abdomen: NT, incision C/D/I with Dermabond Ext: 2+ DTRs, no edema, no cyanosis, negative Homan's sign, no weakness or deficit  Assessment & Plan:  29 y.o. [id number] admitted for severe postpartum hypertension/preeclampsia. Currently on magnesium sulfate x 24 hours, BP stable, no antihypertensive meds currently.  After consultation with Radiologist at [facility]; CT scan of head without contrast ordered.  Concerned about possible PRES or sinus venous thrombosis. If study equivocal or symptoms worsen, will need MR of brain with contrast for further evaluation.   Continue close observation.  Jaynie Collins, MD, FACOG Attending Obstetrician & Gynecologist Faculty Practice, [facility]

## 2014-10-02 NOTE — Progress Notes (Signed)
Patient up to chair for evening meal.  Denies headache, visual disturbance, pain.  Patient expressed that she feels anxious about returning home.  She has a 28 year old, 28 year old, 28 year old, and a newborn.  She reports that the 28 year old and 28 year old are doing well with the baby.  She expressed that the two year old is having a difficult time, at times, with the newborn being at home. He is not understanding about the shared attention; however, he gets very excited about hugging and kissing the baby.  Patient reports anxiety about returning to work and being able to balance the children/home with work.  She states that she and her husband work their schedules so that one is always with the children.  Her sister has come from [place] to help with the children, since she has had the baby.  Patient reports that her sister will be here a couple more weeks and then the patient's mother is supposed to come over from [place] to assist.  Patient reports that it has been 9 years since her mother has come to the [place].  Patient open to social work consult.  Social work consult placed.

## 2014-10-02 NOTE — H&P (Signed)
History and Physical Note  Chief Complaint: Abdominal Pain  First Provider Initiated Contact with Patient 10/01/14 2056    SUBJECTIVE HPI: Paige Bates is a 29 y.o. [id number] on PPD#6 and POD#5 following NSVD and BTL who presents to maternity admissions reporting bilateral abdominal stretching/pulling pain when walking. This pain started yesterday. She denies pain when sitting down or not moving but reports the pain with walking or bending is so severe it takes her breath away. She is taking ibuprofen and Percocet which are not helping. She reports onset of a constant frontal dull headache in MAU that is worsening. She denies epigastric pain or visual disturbances. She reports light lochia, denies vaginal itching/burning, urinary symptoms, dizziness, n/v, or fever/chills.    Past Medical History  Diagnosis Date  . Anemia   . Preterm labor     PTL with 2nd child  . Chronic hepatitis B   . Eczema   . Headache   . Depression     no meds; PP with 2nd prg   Past Surgical History  Procedure Laterality Date  . Tooth extraction    . Tubal ligation Bilateral 09/26/2014    Procedure: POST PARTUM TUBAL LIGATION; Surgeon: Adam Phenix, MD; Location: [facility]; Service: Gynecology; Laterality: Bilateral;   History   Social History  . Marital Status: Married    Spouse Name: N/A  . Number of Children: N/A  . Years of Education: N/A   Occupational History  . Not on file.   Social History Main Topics  . Smoking status: Never Smoker   . Smokeless tobacco: Never Used  . Alcohol Use: No  . Drug Use: No  . Sexual Activity: Yes    Birth Control/ Protection: None   Other Topics Concern  . Not on file   Social History Narrative   Current Facility-Administered Medications on File Prior to Encounter  Medication Dose Route Frequency Provider Last Rate Last Dose  .  tuberculin injection 5 Units 5 Units Intradermal Once Reva Bores, MD     Current Outpatient Prescriptions on File Prior to Encounter  Medication Sig Dispense Refill  . ibuprofen (ADVIL,MOTRIN) 600 MG tablet Take 1 tablet (600 mg total) by mouth every 6 (six) hours. 30 tablet 0  . oxyCODONE-acetaminophen (PERCOCET/ROXICET) 5-325 MG per tablet Take 1-2 tablets by mouth every 4 (four) hours as needed for severe pain. 10 tablet 0  . Prenatal Vit-Fe Fumarate-FA (PRENATAL VITAMIN) 27-0.8 MG TABS Take 1 tablet by mouth daily. 30 tablet 11   Allergies  Allergen Reactions  . Latex Itching and Rash    Review of Systems  Constitutional: Negative for fever, chills and malaise/fatigue.  Eyes: Negative for blurred vision.  Respiratory: Negative for cough and shortness of breath.  Cardiovascular: Negative for chest pain.  Gastrointestinal: Positive for abdominal pain. Negative for heartburn, nausea, vomiting, diarrhea and constipation.  Genitourinary: Negative for dysuria, urgency and frequency.  Musculoskeletal: Negative.  Neurological: Positive for headaches. Negative for dizziness.  Psychiatric/Behavioral: Negative for depression.    OBJECTIVE Blood pressure 159/85, pulse 57, temperature 98.4 F (36.9 C), temperature source Oral, resp. rate 20, height 5\' 4"  (1.626 m), weight 104.327 kg (230 lb), last menstrual period 12/28/2013, SpO2 100 %, currently breastfeeding.   Patient Vitals for the past 24 hrs:  BP Temp Temp src Pulse Resp SpO2 Height Weight  10/01/14 2146 167/94 mmHg - - 66 - - - -  10/01/14 2131 (!) 181/101 mmHg - - (!) 54 - - - -  10/01/14 2116 168/91 mmHg - - (!) 56 - - - -  10/01/14 2101 160/94 mmHg - - (!) 54 - - - -  10/01/14 2046 112/90 mmHg - - (!) 54 - - - -  10/01/14 2031 158/91 mmHg - - 63 - - - -  10/01/14 2029 152/87 mmHg - - (!) 59 - - - -  10/01/14 2013  150/94 mmHg 98.5 F (36.9 C) - 60 18 -  (1.626 m) 104.327 kg (230 lb)   GENERAL: Well-developed, well-nourished female in no acute distress.  EYES: normal sclera/conjunctiva; no lid-lag HENT: Atraumatic, normocephalic HEART: RRR, no murmurs rubs/gallops RESP: clear and equal to auscultation bilaterally in all lobes ABDOMEN: Soft, non-tender MUSCULOSKELETAL: Normal ROM EXTREMITIES: Nontender, no edema NEURO/PSYCH: Alert and oriented, appropriate affect   LAB RESULTS  Lab Results Last 24 Hours    Results for orders placed or performed during the hospital encounter of 10/01/14 (from the past 24 hour(s))  CBC Status: Abnormal   Collection Time: 10/01/14 8:40 PM  Result Value Ref Range   WBC 5.6 4.0 - 10.5 K/uL   RBC 3.90 3.87 - 5.11 MIL/uL   Hemoglobin 10.4 (L) 12.0 - 15.0 g/dL   HCT 40.9 (L) 81.1 - 91.4 %   MCV 81.5 78.0 - 100.0 fL   MCH 26.7 26.0 - 34.0 pg   MCHC 32.7 30.0 - 36.0 g/dL   RDW 78.2 95.6 - 21.3 %   Platelets 230 150 - 400 K/uL  Comprehensive metabolic panel Status: Abnormal   Collection Time: 10/01/14 8:40 PM  Result Value Ref Range   Sodium 138 135 - 145 mmol/L   Potassium 3.9 3.5 - 5.1 mmol/L   Chloride 104 101 - 111 mmol/L   CO2 25 22 - 32 mmol/L   Glucose, Bld 103 (H) 65 - 99 mg/dL   BUN 11 6 - 20 mg/dL   Creatinine, Ser 0.86 0.44 - 1.00 mg/dL   Calcium 9.5 8.9 - 57.8 mg/dL   Total Protein 6.4 (L) 6.5 - 8.1 g/dL   Albumin 3.4 (L) 3.5 - 5.0 g/dL   AST 28 15 - 41 U/L   ALT 32 14 - 54 U/L   Alkaline Phosphatase 61 38 - 126 U/L   Total Bilirubin 0.6 0.3 - 1.2 mg/dL   GFR calc non Af Amer >60 >60 mL/min   GFR calc Af Amer >60 >60 mL/min   Anion gap 9 5 - 15  Urinalysis, Routine w reflex microscopic (not at [facility]) Status: Abnormal   Collection Time: 10/01/14 9:19 PM  Result Value Ref Range   Color, Urine  YELLOW YELLOW   APPearance CLEAR CLEAR   Specific Gravity, Urine <1.005 (L) 1.005 - 1.030   pH 6.0 5.0 - 8.0   Glucose, UA NEGATIVE NEGATIVE mg/dL   Hgb urine dipstick SMALL (A) NEGATIVE   Bilirubin Urine NEGATIVE NEGATIVE   Ketones, ur NEGATIVE NEGATIVE mg/dL   Protein, ur NEGATIVE NEGATIVE mg/dL   Urobilinogen, UA 0.2 0.0 - 1.0 mg/dL   Nitrite NEGATIVE NEGATIVE   Leukocytes, UA NEGATIVE NEGATIVE  Protein / creatinine ratio, urine Status: None   Collection Time: 10/01/14 9:19 PM  Result Value Ref Range   Creatinine, Urine 54.00 mg/dL   Total Protein, Urine <6 mg/dL   Protein Creatinine Ratio   0.00 - 0.15 mg/mg[Cre]  Urine microscopic-add on Status: Abnormal   Collection Time: 10/01/14 9:19 PM  Result Value Ref Range   Squamous Epithelial / LPF RARE RARE   WBC,  UA  <3 WBC/hpf    NO FORMED ELEMENTS SEEN ON URINE MICROSCOPIC EXAMINATION   RBC / HPF 3-6 <3 RBC/hpf   Bacteria, UA FEW (A) RARE      --/--/B POS (07/30 0435)  ASSESSMENT 1. Preeclampsia in postpartum period, unspecified trimester   2. Postop abdominal pain  PLAN Preeclampsia protocol with labetalol orders started in MAU r/t severe range BPs Consult Dr Macon Large, reviewed assessment and labs Admit to AICU for magnesium sulfate  Discussed admission with pt, may room in with infant if another adult present Breast pump provided for pt while in MAU  Sharen Counter Certified Nurse-Midwife 10/02/2014  1:17 AM   Attestation of Attending Supervision of Advanced Practitioner (PA/CNM/NP): Evaluation and management procedures were performed by the Advanced Practitioner under my supervision and collaboration.  I have reviewed the Advanced Practitioner's note and chart, and I agree with the management and plan.  Jaynie Collins, MD, FACOG Attending Obstetrician & Gynecologist Faculty Practice, [facility]

## 2014-10-02 NOTE — Progress Notes (Signed)
S: Called to bedside by RN who states that pt has some questions/concerns about mag therapy. States that she had shakes w/ it in the past and is concerned that it might happen again. She is also still having a R sided frontal HA that is bothering her. Since Mag began, she has become hot all over. She also feels weak on the R side arm and leg, though states that sensation is normal.  O: Filed Vitals:   10/02/14 0000  BP: 159/85  Pulse: 57  Temp:   Resp:   Eyes: PERRLA [place]: normal sensation bilaterally in upper and lower ext and over face MS: no weakness in upper or lower ext bilat  A:I believe pt is having some anxiety associated w/ being readmitted to the hospital. There is no sign of nuerological problem on exam and she seems preoccupied w/ side effects of her medication. Her BP has improved since Mag administration began.  P: #Ativan ordered for anxiety #Will instruct RN to give prn dose of perc for HA #Cont mag as ordered

## 2014-10-02 NOTE — Progress Notes (Signed)
   10/01/14 2300  Vitals  BP (!) 164/88 mmHg  MAP (mmHg) 107  BP Location Left Arm  BP Method Automatic  Patient Position (if appropriate) Lying  Pulse Rate (!) 59  Oxygen Therapy  SpO2 99 %  O2 Device Room Air  Labetalol 20 mg IV administered as ordered. We will continue to monitor.

## 2014-10-03 MED ORDER — TRAMADOL HCL 50 MG PO TABS: 50.0000 mg | ORAL_TABLET | Freq: Four times a day (QID) | ORAL | Status: AC | PRN

## 2014-10-03 NOTE — Progress Notes (Addendum)
AVS reviewed with patient.  Verbalized understanding of discharge instructions, physician follow-up, and medications.  Prescription given to patient for Ultram.  Baby love nurse to see patient this week for blood pressure check; patient verbalized understanding.  Per Dr. Emelda Fear, the Ultram is to replace the Percocet; patient verbalized understanding.  IV removed.  Site WNL.  Patient reports all belongings intact and in possession at time of discharge.  Patient denies pain.  Patient transported by w/c (per patient's request) to main entrance for discharge.  Patient stable at time of discharge.  Patient transported home by husband via car.

## 2014-10-03 NOTE — Discharge Instructions (Signed)
Please call the gyn clinic [phone] for followup appointment approx 10 days after discharge. Please return to the MAU if your concerns are after hours and you cannot reach the Clinic for discussion.    Preeclampsia and Eclampsia Preeclampsia is a serious condition that develops only during pregnancy. It is also called toxemia of pregnancy. This condition causes high blood pressure along with other symptoms, such as swelling and headaches. These may develop as the condition gets worse. Preeclampsia may occur 20 weeks or later into your pregnancy.  Diagnosing and treating preeclampsia early is very important. If not treated early, it can cause serious problems for you and your baby. One problem it can lead to is eclampsia, which is a condition that causes muscle jerking or shaking (convulsions) in the mother. Delivering your baby is the best treatment for preeclampsia or eclampsia.  RISK FACTORS The cause of preeclampsia is not known. You may be more likely to develop preeclampsia if you have certain risk factors. These include:   Being pregnant for the first time.  Having preeclampsia in a past pregnancy.  Having a family history of preeclampsia.  Having high blood pressure.  Being pregnant with twins or triplets.  Being 58 or older.  Being African American.  Having kidney disease or diabetes.  Having medical conditions such as lupus or blood diseases.  Being very overweight (obese). SIGNS AND SYMPTOMS  The earliest signs of preeclampsia are:  High blood pressure.  Increased protein in your urine. Your health care provider will check for this at every prenatal visit. Other symptoms that can develop include:   Severe headaches.  Sudden weight gain.  Swelling of your hands, face, legs, and feet.  Feeling sick to your stomach (nauseous) and throwing up (vomiting).  Vision problems (blurred or double vision).  Numbness in your face, arms, legs, and  feet.  Dizziness.  Slurred speech.  Sensitivity to bright lights.  Abdominal pain. DIAGNOSIS  There are no screening tests for preeclampsia. Your health care provider will ask you about symptoms and check for signs of preeclampsia during your prenatal visits. You may also have tests, including:  Urine testing.  Blood testing.  Checking your baby's heart rate.  Checking the health of your baby and your placenta using images created with sound waves (ultrasound). TREATMENT  You can work out the best treatment approach together with your health care provider. It is very important to keep all prenatal appointments. If you have an increased risk of preeclampsia, you may need more frequent prenatal exams.  Your health care provider may prescribe bed rest.  You may have to eat as little salt as possible.  You may need to take medicine to lower your blood pressure if the condition does not respond to more conservative measures.  You may need to stay in the hospital if your condition is severe. There, treatment will focus on controlling your blood pressure and fluid retention. You may also need to take medicine to prevent seizures.  If the condition gets worse, your baby may need to be delivered early to protect you and the baby. You may have your labor started with medicine (be induced), or you may have a cesarean delivery.  Preeclampsia usually goes away after the baby is born. HOME CARE INSTRUCTIONS   Only take over-the-counter or prescription medicines as directed by your health care provider.  Lie on your left side while resting. This keeps pressure off your baby.  Elevate your feet while resting.  Get regular  exercise. Ask your health care provider what type of exercise is safe for you.  Avoid caffeine and alcohol.  Do not smoke.  Drink 6-8 glasses of water every day.  Eat a balanced diet that is low in salt. Do not add salt to your food.  Avoid stressful situations as  much as possible.  Get plenty of rest and sleep.  Keep all prenatal appointments and tests as scheduled. SEEK MEDICAL CARE IF:  You are gaining more weight than expected.  You have any headaches, abdominal pain, or nausea.  You are bruising more than usual.  You feel dizzy or light-headed. SEEK IMMEDIATE MEDICAL CARE IF:   You develop sudden or severe swelling anywhere in your body. This usually happens in the legs.  You gain 5 lb (2.3 kg) or more in a week.  You have a severe headache, dizziness, problems with your vision, or confusion.  You have severe abdominal pain.  You have lasting nausea or vomiting.  You have a seizure.  You have trouble moving any part of your body.  You develop numbness in your body.  You have trouble speaking.  You have any abnormal bleeding.  You develop a stiff neck.  You pass out. MAKE SURE YOU:   Understand these instructions.  Will watch your condition.  Will get help right away if you are not doing well or get worse. Document Released: 02/10/2000 Document Revised: 02/17/2013 Document Reviewed: 12/05/2012 [facility] Patient Information 2015 [place], [place]. This information is not intended to replace advice given to you by your health care provider. Make sure you discuss any questions you have with your health care provider.

## 2014-10-03 NOTE — Discharge Summary (Signed)
Physician Discharge Summary  Patient ID: Paige Bates MRN: [id number] DOB/AGE: Jan 05, 1986 29 y.o.  Admit date: 10/01/2014 Discharge date: 10/03/2014  Admission Diagnoses:   Hypertension in pregnancy, preeclampsia, severe, postpartum condition Discharge Diagnoses:  Principal Problem:   Hypertension in pregnancy, preeclampsia, severe, postpartum condition   Discharged Condition: good  Hospital Course: HPI: Paige Bates is a 29 y.o. [id number] on PPD#6 and POD#5 following NSVD and BTL who presents to maternity admissions reporting bilateral abdominal stretching/pulling pain when walking. This pain started yesterday. She denies pain when sitting down or not moving but reports the pain with walking or bending is so severe it takes her breath away. She is taking ibuprofen and Percocet which are not helping. She reports onset of a constant frontal dull headache in MAU that is worsening. She denies epigastric pain or visual disturbances. She reports light lochia, denies vaginal itching/burning, urinary symptoms, dizziness, n/v, or fever/chills.  She complained of headache and CT scan was done which was normal. 24 hrs of magnesium sulfate iv was completed    Consults: None  Significant Diagnostic Studies: radiology: CT scan: head , normal  Treatments: IV hydration and magnesium sulfate, labetalol prn  Discharge Exam: Blood pressure 126/87, pulse 69, temperature 98.1 F (36.7 C), temperature source Oral, resp. rate 18, height  (1.626 m), weight 102.83 kg (226 lb 11.2 oz), last menstrual period 12/28/2013, SpO2 99 %, currently breastfeeding. General appearance: alert, cooperative and no distress GI: soft, non-tender; bowel sounds normal; no masses,  no organomegaly Extremities: extremities normal, atraumatic, no cyanosis or edema  Disposition: 01-Home or Self Care  Discharge Instructions    Baby Love Nurse Visit    Complete by:  As directed   This week            Medication  List    TAKE these medications        oxyCODONE-acetaminophen 5-325 MG per tablet  Commonly known as:  PERCOCET/ROXICET  Take 1-2 tablets by mouth every 4 (four) hours as needed for severe pain.      ASK your doctor about these medications        ibuprofen 600 MG tablet  Commonly known as:  ADVIL,MOTRIN  Take 1 tablet (600 mg total) by mouth every 6 (six) hours.     Prenatal Vitamin 27-0.8 MG Tabs  Take 1 tablet by mouth daily.           Follow-up Information    Follow up with [facility] In 3 weeks.   Specialty:  Obstetrics and Gynecology   Contact information:   [address] [place] [address] [phone]      Signed: Scheryl Darter 10/03/2014, 7:45 AM

## 2014-10-03 NOTE — Progress Notes (Signed)
Acknowledged MD order for social work consult due to re-admission for increase anxiety and mother is concerned about being able to handle her responsibilities at home.  She has 4 children ages 23, 84, 2 and a newborn.  Met with MOB.  Her husband was also present but did not participate much in the discussion.   Allowed mother to talk about her feelings, fears, and concerns.  She reports feeling overwhelmed with all her responsibilities at home.  Her sister is currently visiting from [place] and her mother is expected to visit after the sister returns to [place].  Discussed different coping strategies and highlighter those responsibilities in order of priority.  Emphasized the need for self care and importance of allowing others to help out so that she could get the proper rest. Mother states that her church is very supportive, and willing to help the family.  Validated her feeling and provided supportive feedback and encouragement throughout the discussion.

## 2014-10-04 ENCOUNTER — Encounter: Payer: Self-pay | Admitting: Obstetrics & Gynecology

## 2014-10-04 NOTE — Progress Notes (Signed)
Post discharge chart review completed.  

## 2014-10-04 NOTE — Addendum Note (Signed)
Addendum  created 10/04/14 2023 by Leilani Able, MD   Modules edited: Anesthesia Responsible Staff

## 2014-10-08 ENCOUNTER — Ambulatory Visit (HOSPITAL_COMMUNITY)
Admission: RE | Admit: 2014-10-08 | Discharge: 2014-10-08 | Disposition: A | Discharge status: Home / Self Care | Payer: Medicaid Other | Source: Ambulatory Visit | Attending: Obstetrics & Gynecology | Admitting: Obstetrics & Gynecology

## 2014-10-08 NOTE — Lactation Note (Signed)
Lactation Consult  Mother's reason for visit:  I want help with latching baby without NS Visit Type:  Feeding assessment Appointment Notes:  Large nipples, using NS Consult:  Follow-Up Lactation Consultant:  Audry Riles D  ________________________________________________________________________    ________________________________________________________________________  Mother's Name: Candee Furbish Castor Type of delivery:   Breastfeeding Experience:  P4- had DL with first 2 girls but last baby nursed for 9 months  ________________________________________________________________________  Breastfeeding History (Post Discharge)  Frequency of breastfeeding:  3-4 times/day but he is still hungry after nursing Duration of feeding:  15 min    Mom has large slightly inverted nipples, Theron Arista is not opening his mouth wide and is doing some tongue thrusting which makes latch to #24 NS difficult. Did finally latch and nurse for 10 min but no weight gain. Tried on other breast but Theron Arista was getting very fussy and mom wanted to bottle feed formula. Mom reports her breasts do not feel as full as last week. Is only pumping about 4 times/day. Just got pump from [facility] this afternoon. Was using our Health and safety inspector. Encouraged to pump 8 times/day to promote a good milk supply. Bottle fed 2 oz formula and Theron Arista had some difficulty with bottle feeding also. Encouraged trying to latch when Theron Arista is not really hungry- may need to feed a small amount by bottle then try to get him to latch. Encouraged when bottle feeding to wait for wide open mouth also  No questions at present. To call prn  Pre-feed weight:  3328 g  (7 lb. 5.4 oz.) Post-feed weight:  3328 g (7 lb. 5.4 oz.) Amount transferred:  0 ml Amount supplemented:  60  ml      Total amount transferred:  0 ml Total supplement given:  60 ml

## 2014-10-11 ENCOUNTER — Encounter (HOSPITAL_COMMUNITY): Payer: Self-pay | Admitting: Obstetrics & Gynecology

## 2014-10-11 NOTE — Addendum Note (Signed)
Addendum  created 10/11/14 0729 by Phillips Grout, MD   Modules edited: Anesthesia Events

## 2014-10-13 ENCOUNTER — Ambulatory Visit: Payer: Medicaid Other | Admitting: *Deleted

## 2014-10-13 DIAGNOSIS — Z0130 Encounter for examination of blood pressure without abnormal findings: Secondary | ICD-10-CM

## 2014-10-13 NOTE — Progress Notes (Signed)
Here for bp check after having severe preeclampsia and vaginal delivery 09/25/14 . Was put on magnesium and d/c day 5.   Today c/o occasional headaches up to 8.  C/o blurry vision sometimes.  No edema noted. States had home visit nurse check bp on 10/11/14 and states it was 127/84.  BP and symptoms [place] to [place]. Patient instructed per [place] may discharge and instructed to keep postpartum appointment. Time and date of appointment reviewed with patient.

## 2014-10-18 ENCOUNTER — Encounter (HOSPITAL_COMMUNITY): Payer: Medicaid Other

## 2014-10-18 ENCOUNTER — Ambulatory Visit (HOSPITAL_COMMUNITY)
Admission: RE | Admit: 2014-10-18 | Discharge: 2014-10-18 | Disposition: A | Discharge status: Home / Self Care | Payer: Medicaid Other | Source: Ambulatory Visit | Attending: Obstetrics & Gynecology | Admitting: Obstetrics & Gynecology

## 2014-10-18 NOTE — Lactation Note (Signed)
Lactation Consult  Mother's reason for visit:  Help with latch Visit Type:  Outpatient Appointment Notes:  Referral from Pediatrician Consult:  Follow-Up Lactation Consultant:  Judee Clara  ________________________________________________________________________  Joan Flores Name: Garner Gavel III Date of Birth: [birth year]/25/2013 Pediatrician: Dr. Delfino Lovett [facility]) Gender: female Gestational Age: [redacted]w[redacted]d (At Birth) Birth Weight: 7 lb 8.1 oz (3405 g) Weight at Discharge: Weight: 7 lb 1.9 oz (3230 g)Date of Discharge: 09/27/2014 [facility] Weights   09/25/14 1913 09/27/14 0026  Weight: 7 lb 8.1 oz (3405 g) 7 lb 1.9 oz (3230 g)   Weight today: 8 lbs 1.3 oz (3666)   Chalice continues to have difficulty with breast feeding.  She can latch baby on with nipple shield (#24), and baby will feed for up to an hour.  He is hungry following feeding, and she feeds him either her pumped breast milk+/formula 1-2 oz by bottle.  (10 days ago, at OP appointment, baby fed at the breast using a nipple shield, and was not able to transfer any milk.)  Latina has a large diameter, short shaft nipple, that inverts when breast sandwiched.  Tried to latch baby onto breast without nipple shield, but he physically could not open wide enough.  Baby has a tight mouth, with a large tongue.  On digital exam, he thrusts his tongue out of his mouth.  Tight frenulum not apparent, but when finger swept under tongue, a posterior frenulum felt.  Pediatrician referred Shianna to call an ENT in [place] (she hasn't yet).  Resource handout on Tongue Tie given to Mom.  Baby fed at the breast for 25 minutes, and 4 ml transferred.  Mom tried on both breasts, but baby not able to latch deep enough, and pull her nipple out into shield.  Encouraged Mom to pump more often, as unsure of how much she is pump.  Encouraged double pumping rather than one at a time.  [place] information given, as Mom on  [place] now.   While talking, Tishana began to tear up with feelings of being overwhelmed.  She was readmitted to AICU, and on MgSO4 postpartum for PIH.  Her BP has returned to [facility], but she wasn't able to pump often while hospitalized.  She has 3 other children at home, and she isn't resting much.  Discussed the "Feelings After Birth" support group available, and information about when and where given to her.    1- Pump both breasts more often, skin to skin,  Nuzzle, massage breasts- Goal to try to increase milk supply 2- Rest more 3- Get help so she can take a nap in afternoon if she can 4- [place] information 5- ENT information 6- Feelings After Birth Support Group ________________________________________________________________________  Mother's Name: Saranda L Shumard Type of delivery:  Vaginal Breastfeeding Experience: 9 months with 3rd baby Maternal Medical Conditions: Post Partum Pregnancy induced hypertension  Maternal Medications:  PNV  ________________________________________________________________________  Breastfeeding History (Post Discharge)  Frequency of breastfeeding:  Every 2 hrs Duration of feeding:  30-60 minutes  Supplementation  Formula:  Volume 30ml Frequency:  After every breast feed Total volume per day:        Brand: Similac  Breastmilk:  Volume 30 ml (120 ml first thing in am) Frequency:  Every 2 hrs Total volume per day:  Method:  Bottle  Infant Intake and Output Assessment  Voids:  6 in 24 hrs.  Color:  Clear yellow Stools:  2-3 in 24 hrs.  Color:  Yellow  ________________________________________________________________________  Maternal Breast Assessment  Breast:  Full Nipple:  Inverted Pain level:  0 Pain interventions:    _______________________________________________________________________ Feeding Assessment/Evaluation  Initial feeding assessment:  Infant's oral assessment:  Variance  Positioning:  Cross cradle Right  breast  LATCH documentation:  Latch:  2 = Grasps breast easily, tongue down, lips flanged, rhythmical sucking.  Audible swallowing: 1 some swallowing heard  Type of nipple:  2 = Everted at rest and after stimulation  Comfort (Breast/Nipple):  1 = Filling, red/small blisters or bruises, mild/mod discomfort  Hold (Positioning):  1 = Assistance needed to correctly position infant at breast and maintain latch  LATCH score: 7  Attached assessment:  Shallow  Lips flanged:  Yes.    Lips untucked:  Yes.    Suck assessment:  Displays both  Tools:  Nipple shield 24 mm Instructed on use and cleaning of tool:  Yes.    Pre-feed weight:  3666 g   Post-feed weight:  3670 g  Amount transferred:  4 ml Amount supplemented:  60 ml

## 2014-10-22 ENCOUNTER — Encounter: Payer: Self-pay | Admitting: Obstetrics & Gynecology

## 2014-10-22 ENCOUNTER — Ambulatory Visit (INDEPENDENT_AMBULATORY_CARE_PROVIDER_SITE_OTHER): Payer: Self-pay | Admitting: Obstetrics & Gynecology

## 2014-10-22 VITALS — BP 122/80 | HR 90 | Temp 98.8°F | Wt 219.4 lb

## 2014-10-22 DIAGNOSIS — K432 Incisional hernia without obstruction or gangrene: Secondary | ICD-10-CM

## 2014-10-22 NOTE — Patient Instructions (Signed)

## 2014-10-22 NOTE — Progress Notes (Signed)
Referral to General Surgeon ordered. Verified with registar patient has [employer] access. Instructed patient to call the provider on her medicaid card , she states she doesn't have a card and hasn't seen that provider . Gave patient name of her pcp and phone number with instructions to call them to get referral to surgeon- informed her they may need to see her in their office.  Patient states she was talking with her medicaid worker about changing pcp- instructed her that a pcp will have to refer her and to call today- either pcp or Patent attorney.  Also informed her if it takes her a while to get referral and her pain becomes severe to go to the ER. She voices understanding.

## 2014-10-22 NOTE — Progress Notes (Signed)
Patient ID: Paige Bates, female   DOB: 04-May-1985, 29 y.o.   MRN: [id number]  Chief Complaint  Patient presents with  . Belly button sore  after pp BTL done 7/31 HPI Paige Bates is a 28 y.o. female.  [id number], 1 mo PP, 2 days of pain, drainage at umbilical incision  HPI  Past Medical History  Diagnosis Date  . Anemia   . Preterm labor     PTL with 2nd child  . Chronic hepatitis B   . Eczema   . Headache   . Depression     no meds; PP with 2nd prg    Past Surgical History  Procedure Laterality Date  . Tooth extraction    . Tubal ligation Bilateral 09/26/2014    Procedure: POST PARTUM TUBAL LIGATION;  Surgeon: Adam Phenix, MD;  Location: [facility];  Service: Gynecology;  Laterality: Bilateral;    Family History  Problem Relation Age of Onset  . Other Neg Hx   . Asthma Neg Hx   . Cancer Neg Hx   . Diabetes Neg Hx   . Hearing loss Neg Hx   . Heart disease Neg Hx   . Hypertension Neg Hx   . Stroke Neg Hx     Social History Social History  Substance Use Topics  . Smoking status: Never Smoker   . Smokeless tobacco: Never Used  . Alcohol Use: No    Allergies  Allergen Reactions  . Latex Itching and Rash    Current Outpatient Prescriptions  Medication Sig Dispense Refill  . ibuprofen (ADVIL,MOTRIN) 600 MG tablet Take 1 tablet (600 mg total) by mouth every 6 (six) hours. 30 tablet 0  . oxyCODONE-acetaminophen (PERCOCET/ROXICET) 5-325 MG per tablet Take 1-2 tablets by mouth every 4 (four) hours as needed for severe pain. 10 tablet 0  . Prenatal Vit-Fe Fumarate-FA (PRENATAL VITAMIN) 27-0.8 MG TABS Take 1 tablet by mouth daily. 30 tablet 11  . traMADol (ULTRAM) 50 MG tablet Take 1 tablet (50 mg total) by mouth every 6 (six) hours as needed for moderate pain or severe pain. 30 tablet 0   No current facility-administered medications for this visit.   Facility-Administered Medications Ordered in Other Visits  Medication Dose Route Frequency Provider Last Rate Last  Dose  . tuberculin injection 5 Units  5 Units Intradermal Once Reva Bores, MD        Review of Systems Review of Systems  Constitutional: Positive for appetite change (decreased).  Gastrointestinal: Positive for abdominal distention (at umbilicus). Negative for nausea.  Genitourinary: Negative for vaginal bleeding.    Blood pressure 122/80, pulse 90, temperature 98.8 F (37.1 C), weight 219 lb 6.4 oz (99.519 kg), currently breastfeeding.  Physical Exam Physical Exam  Constitutional: She is oriented to person, place, and time. She appears well-developed. No distress.  Pulmonary/Chest: Effort normal.  Abdominal: Soft. She exhibits distension (at umbilicus, tender, incison intact).  Neurological: She is alert and oriented to person, place, and time.  Skin: Skin is warm and dry.  Psychiatric: She has a normal mood and affect. Her behavior is normal.    Data Reviewed Op note  Assessment    Possible incisional hernia after BTL     Plan    Want to refer to general surgery, needs PCP to make referral. To [facility] if sx persist or increase. RTC here as scheduled. Precautions given        ARNOLD,JAMES 10/22/2014, 11:49 AM

## 2014-10-22 NOTE — Progress Notes (Signed)
C/o soreness at belly button and pain for 2 days , states=10 when you touch belly button. C/o brownish discharge. Had BTL 09/26/14.

## 2014-11-04 ENCOUNTER — Ambulatory Visit: Payer: Medicaid Other | Admitting: Obstetrics & Gynecology

## 2014-11-15 HISTORY — PX: UMBILICAL HERNIA REPAIR: SHX196

## 2014-11-24 ENCOUNTER — Encounter: Payer: Self-pay | Admitting: Obstetrics & Gynecology

## 2014-11-24 ENCOUNTER — Ambulatory Visit (INDEPENDENT_AMBULATORY_CARE_PROVIDER_SITE_OTHER): Payer: Medicaid Other | Admitting: Obstetrics & Gynecology

## 2014-11-24 VITALS — BP 126/77 | HR 76 | Temp 98.6°F | Resp 20 | Ht 64.0 in | Wt 224.8 lb

## 2014-11-24 DIAGNOSIS — T814XXD Infection following a procedure, subsequent encounter: Principal | ICD-10-CM

## 2014-11-24 DIAGNOSIS — IMO0001 Reserved for inherently not codable concepts without codable children: Secondary | ICD-10-CM

## 2014-11-24 NOTE — Patient Instructions (Signed)
Postpartum Tubal Ligation °Care After °Refer to this sheet in the next few weeks. These instructions provide you with information on caring for yourself after your procedure. Your caregiver may also give you more specific instructions. Your treatment has been planned according to current medical practices, but problems sometimes occur. Call your caregiver if you have any problems or questions after your procedure. °HOME CARE INSTRUCTIONS  °· Rest the remainder of the day. °· Only take over-the-counter or prescription medicines for pain, discomfort, or fever as directed by your caregiver. Do not take aspirin. It can cause bleeding. °· Gradually resume daily activities, diet, rest, driving, and work. °· Avoid sexual intercourse for 2 weeks or as directed. °· Do not drive while taking pain medicine. °· Do not lift anything over 5 pounds for 2 weeks or as directed. °· Only take showers, not baths, until you are seen by your caregiver. °· Change bandages (dressings) as directed. °· Take your temperature twice a day and record it. °· Try to have help for the first 7-10 days for your household needs. °· Return to your caregiver to get your stitches (sutures) removed and for follow-up visits as directed. °SEEK MEDICAL CARE IF:  °· You have redness, swelling, or increasing pain in the wound. °· You have drainage from the wound lasting longer than 1 day. °· Your pain is getting worse. °· You have a rash. °· You become dizzy or lightheaded. °· You have a reaction to your medicine. °· You need stronger medicine or a change in your pain medicine. °· You notice a bad smell coming from the wound or dressing. °· Your wound breaks open after the sutures have been removed. °· You are constipated. °SEEK IMMEDIATE MEDICAL CARE IF:  °· You faint. °· You have a fever. °· You have increasing abdominal pain. °· You have severe pain in your shoulders. °· You have bleeding or drainage from the suture sites or vagina following surgery. °· You  have shortness of breath or difficulty breathing. °· You have chest or leg pain. °· You have persistent nausea, vomiting, or diarrhea. °MAKE SURE YOU:  °· Understand these instructions. °· Watch your condition. °· Get help right away if you are not doing well or get worse. °Document Released: 08/14/2011 Document Reviewed: 08/14/2011 °[employer]® Patient Information ©2015 [employer]. This information is not intended to replace advice given to you by your health care provider. Make sure you discuss any questions you have with your health care provider. ° °

## 2014-11-24 NOTE — Progress Notes (Signed)
Patient ID: Paige Bates, female   DOB: 05/10/1985, 29 y.o.   MRN: [id number]  Chief Complaint  Patient presents with  . Postpartum Care  had surgical wound infection and hernia repair done 8 days ago in [place].  HPI Paige Bates is a 29 y.o. female.  S/p surgery at [facility]  HPI  Past Medical History  Diagnosis Date  . Anemia   . Preterm labor     PTL with 2nd child  . Chronic hepatitis B   . Eczema   . Headache   . Depression     no meds; PP with 2nd prg    Past Surgical History  Procedure Laterality Date  . Tooth extraction    . Tubal ligation Bilateral 09/26/2014    Procedure: POST PARTUM TUBAL LIGATION;  Surgeon: Adam Phenix, MD;  Location: [facility];  Service: Gynecology;  Laterality: Bilateral;  . Umbilical hernia repair  11/15/14    performed @ [facility] in [place]    Family History  Problem Relation Age of Onset  . Other Neg Hx   . Asthma Neg Hx   . Cancer Neg Hx   . Diabetes Neg Hx   . Hearing loss Neg Hx   . Heart disease Neg Hx   . Hypertension Neg Hx   . Stroke Neg Hx     Social History Social History  Substance Use Topics  . Smoking status: Never Smoker   . Smokeless tobacco: Never Used  . Alcohol Use: No    Allergies  Allergen Reactions  . Latex Itching and Rash    Current Outpatient Prescriptions  Medication Sig Dispense Refill  . ibuprofen (ADVIL,MOTRIN) 600 MG tablet Take 1 tablet (600 mg total) by mouth every 6 (six) hours. 30 tablet 0  . Prenatal Vit-Fe Fumarate-FA (PRENATAL VITAMIN) 27-0.8 MG TABS Take 1 tablet by mouth daily. 30 tablet 11  . oxyCODONE-acetaminophen (PERCOCET/ROXICET) 5-325 MG per tablet Take 1-2 tablets by mouth every 4 (four) hours as needed for severe pain. (Patient not taking: Reported on 11/24/2014) 10 tablet 0  . traMADol (ULTRAM) 50 MG tablet Take 1 tablet (50 mg total) by mouth every 6 (six) hours as needed for moderate pain or severe pain. (Patient not taking: Reported on 11/24/2014) 30 tablet 0   No current  facility-administered medications for this visit.   Facility-Administered Medications Ordered in Other Visits  Medication Dose Route Frequency Provider Last Rate Last Dose  . tuberculin injection 5 Units  5 Units Intradermal Once Reva Bores, MD        Review of Systems Review of Systems  Gastrointestinal: Positive for abdominal distention.  Genitourinary: Negative for vaginal bleeding, vaginal discharge and pelvic pain.    Blood pressure 126/77, pulse 76, temperature 98.6 F (37 C), temperature source Oral, resp. rate 20, height  (1.626 m), weight 224 lb 12.8 oz (101.969 kg), currently breastfeeding.  Physical Exam Physical Exam  Constitutional: She appears well-developed.  Cardiovascular: Normal rate.   Pulmonary/Chest: Effort normal.  Abdominal: Soft.  Neurological: She is alert.  Skin: Skin is warm.  Psychiatric: She has a normal mood and affect. Her behavior is normal.    Data Reviewed Pap 2013 nl  Assessment    POD 8 after hernia repair     Plan    RTC for routine gyn care, last pap was 11/2011 nl        Paige Bates 11/24/2014, 3:38 PM

## 2014-11-24 NOTE — Progress Notes (Signed)
Subjective:     Paige Bates is a 29 y.o. female who presents for a postpartum visit. She is 8 weeks postpartum following a vaginal delivery. I have fully reviewed the prenatal and intrapartum course. The delivery was at 39 gestational weeks. Outcome: normal vaginal delivery - delivered. Anesthesia: epidural. Postpartum course has been complicated by post-partum pre-ecclampsia, umbilical hernia and cyst repair on 9/19 @ [facility]. Baby's course has been unremarkable except for nasal congestion. Baby is feeding by breast and bottle. Bleeding - none. Bowel function is normal. Bladder function is normal. Patient is not sexually active. Contraception method is BTS. Postpartum depression screening: 6.  No headaches or vision changes. No extremity swelling.   The following portions of the patient's history were reviewed and updated as appropriate: allergies, current medications, past family history, past medical history, past social history, past surgical history and problem list.  Review of Systems A comprehensive review of systems was negative.   Objective:    BP 126/77 mmHg  Pulse 76  Temp(Src) 98.6 F (37 C) (Oral)  Resp 20  Ht  (1.626 m)  Wt 224 lb 12.8 oz (101.969 kg)  BMI 38.57 kg/m2  Breastfeeding? Yes  General:  alert, cooperative, appears stated age and no distress   Breasts:  Deferred  Lungs: clear to auscultation bilaterally  Heart:  regular rate and rhythm, S1, S2 normal, no murmur, click, rub or gallop  Abdomen: soft, non-tender; bowel sounds normal; no masses,  no organomegaly   Vulva:  not evaluated  Vagina: not evaluated  Cervix:  Not evaluated  Corpus: not examined  Adnexa:  not evaluated  Rectal Exam: Not performed.        Assessment:     Normal postpartum exam. Pap smear not done at today's visit.   Plan:    1. Contraception: tubal ligation 2. Will return for pap testing 3. Follow up in: 1 year or as needed.

## 2014-11-26 ENCOUNTER — Encounter: Payer: Self-pay | Admitting: *Deleted

## 2015-06-13 ENCOUNTER — Ambulatory Visit (INDEPENDENT_AMBULATORY_CARE_PROVIDER_SITE_OTHER): Payer: Medicaid Other

## 2015-06-13 VITALS — BP 107/59 | HR 71

## 2015-06-13 DIAGNOSIS — Z23 Encounter for immunization: Secondary | ICD-10-CM | POA: Diagnosis present

## 2015-08-27 IMAGING — US US OB FOLLOW-UP
1 series · 12 of 28 positions shown · non-contrast
Comparison: none

[Series 1: us ob follow-up · 0.21mm/px · 57 acquisitions, 12 frames shown]
[im 3/57]
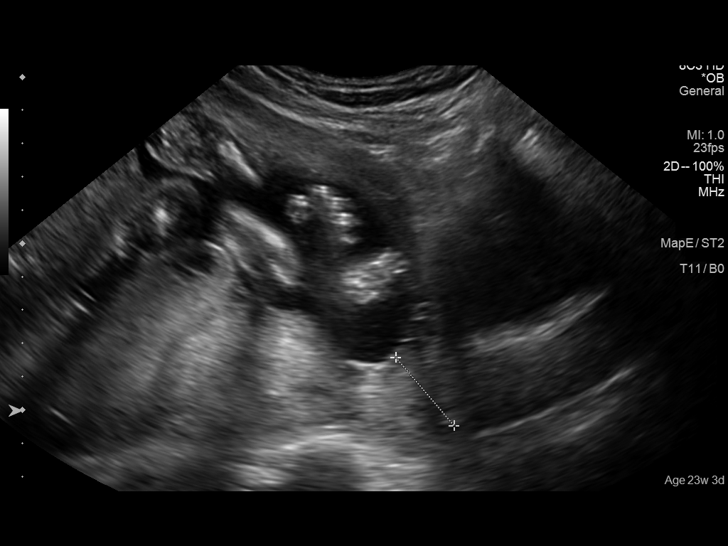
[im 7/57]
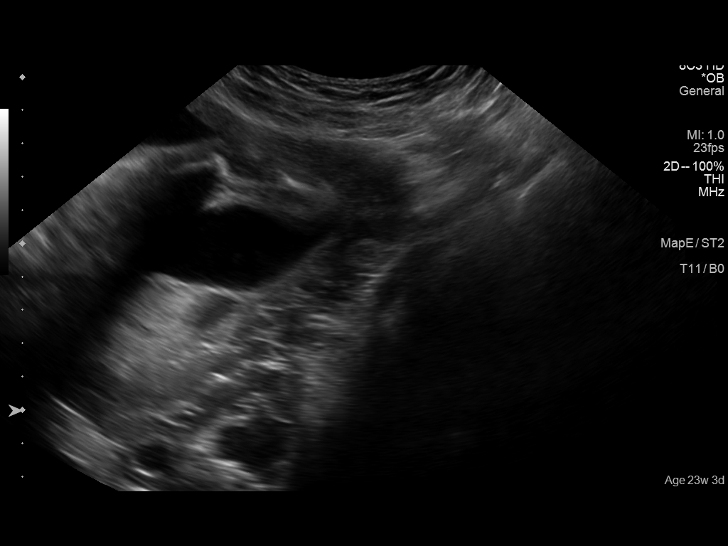
[im 11/57]
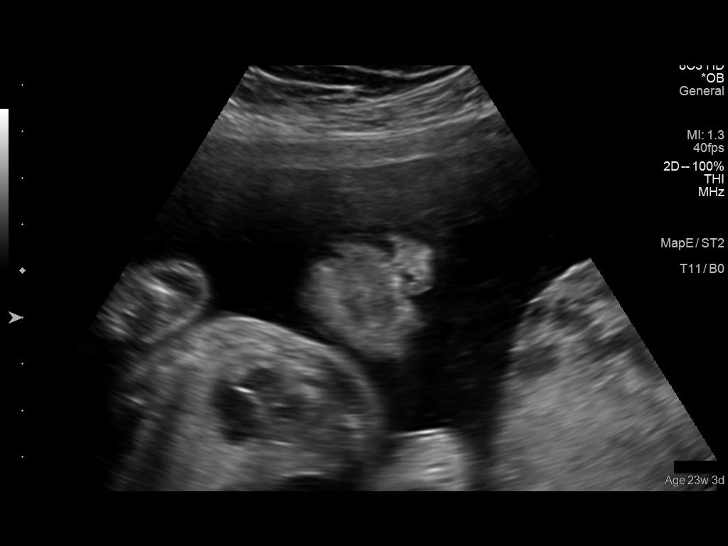
[im 17/57]
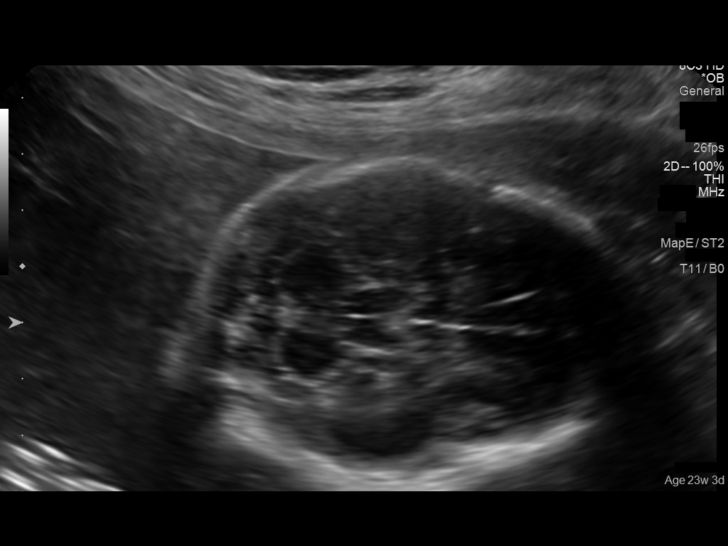
[im 21/57]
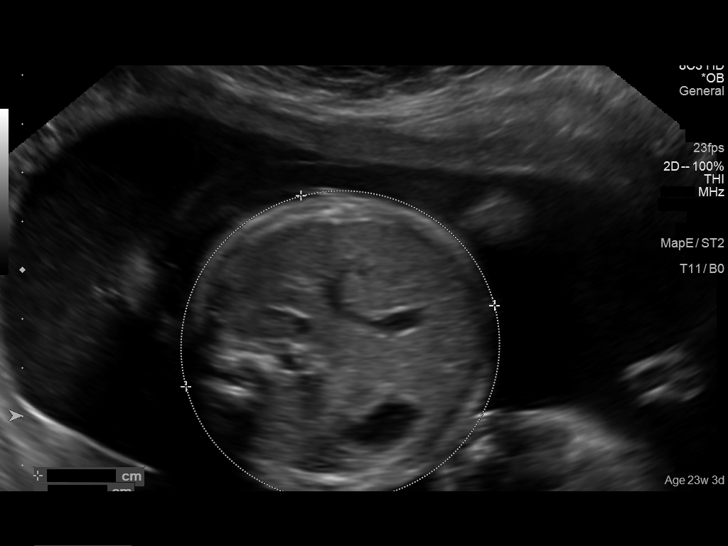
[im 25/57]
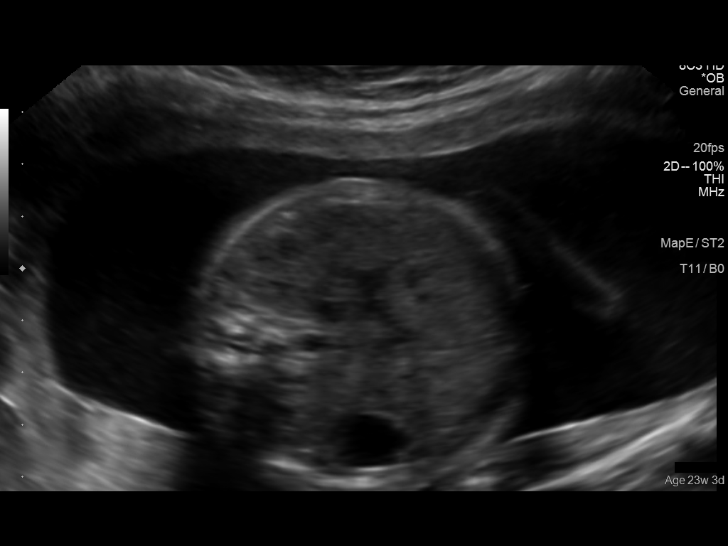
[im 32/57]
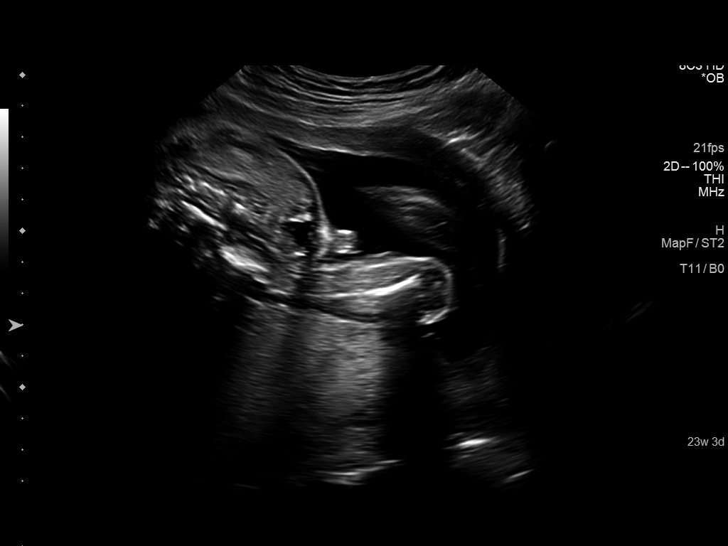
[im 36/57]
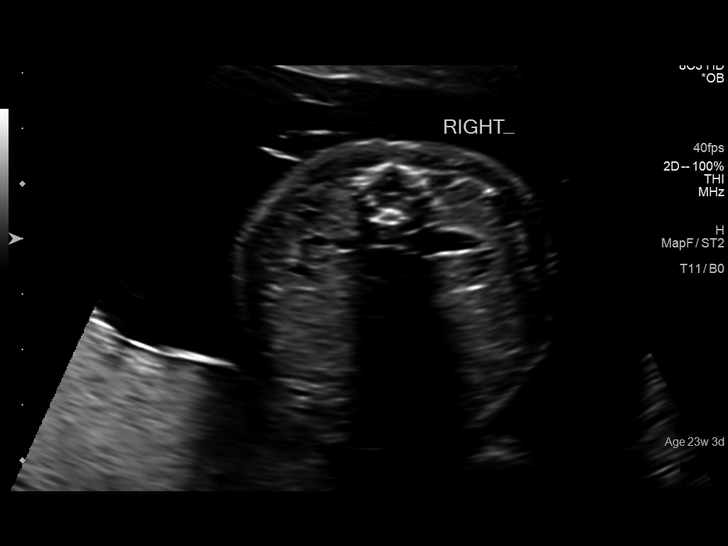
[im 40/57]
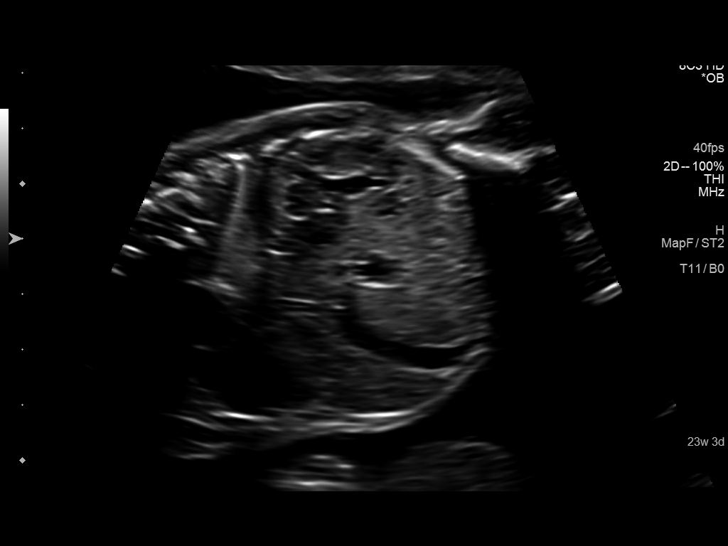
[im 46/57]
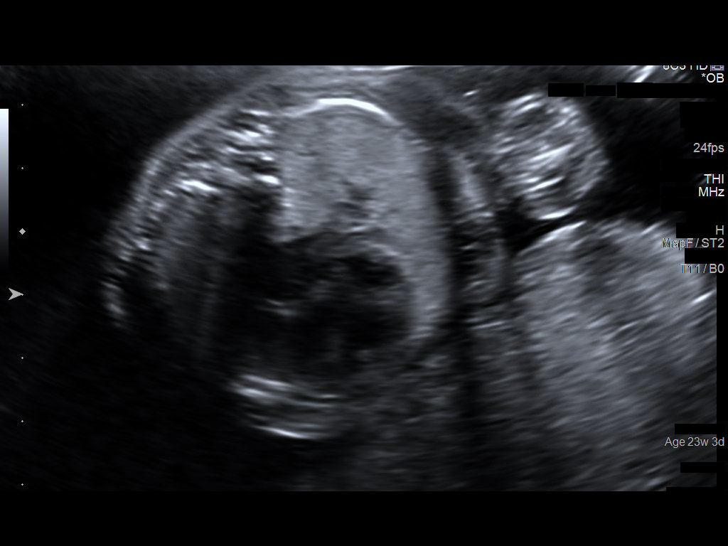
[im 50/57]
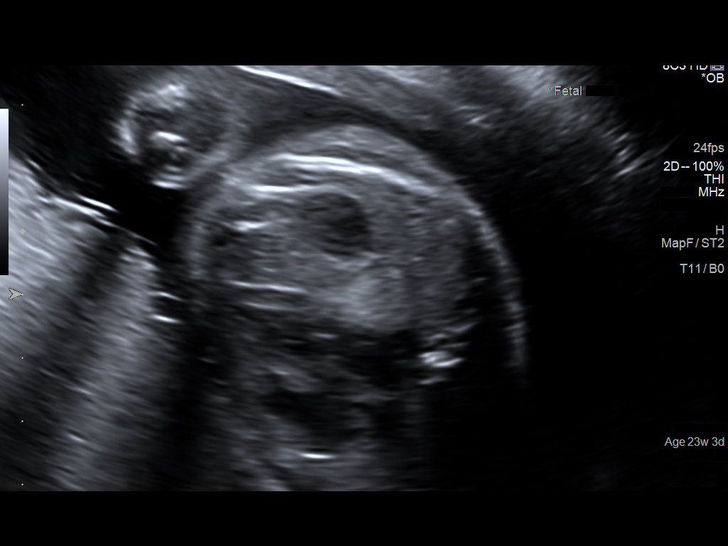
[im 54/57]
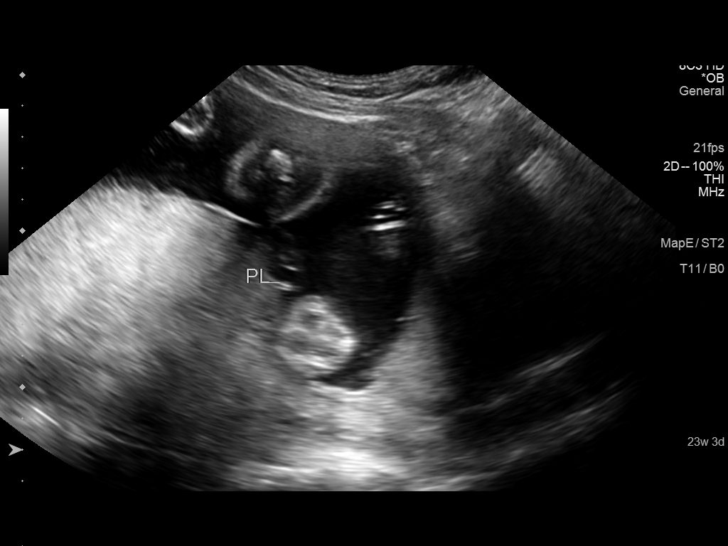

[12 of 28 positions shown; findings below may reference images not displayed]

OBSTETRICS REPORT

Service(s) Provided

US OB FOLLOW UP                                       76816.1
Indications

Poor obstetric history (prior pre-term labor)
Obesity complicating pregnancy, second trimester
Follow-up incomplete fetal anatomic evaluation        Z36
23 weeks gestation of pregnancy
Fetal Evaluation

Num Of Fetuses:    1
Fetal Heart Rate:  153                          bpm
Cardiac Activity:  Observed
Presentation:      Breech
Placenta:          Posterior, above cervical
os
P. Cord            Previously Visualized
Insertion:

Amniotic Fluid
AFI FV:      Subjectively within normal limits
Larg Pckt:     6.4  cm
Biometry

BPD:     58.6  mm     G. Age:  24w 0d                CI:         70.5   70 - 86
FL/HC:      20.2   19.2 -
20.8
HC:     222.5  mm     G. Age:  24w 2d       67  %    HC/AC:      1.09   1.05 -
1.21
AC:     203.6  mm     G. Age:  25w 0d       84  %    FL/BPD:     76.8   71 - 87
FL:        45  mm     G. Age:  24w 6d       81  %    FL/AC:      22.1   20 - 24

Est. FW:     737  gm    1 lb 10 oz      75  %
Gestational Age

LMP:           23w 3d        Date:  12/28/13                 EDD:   10/04/14
U/S Today:     24w 4d                                        EDD:   09/26/14
Best:          23w 3d     Det. By:  LMP  (12/28/13)          EDD:   10/04/14
Anatomy
Cranium:          Appears normal         Aortic Arch:      Appears normal
Fetal Cavum:      Appears normal         Ductal Arch:      Previously seen
Ventricles:       Appears normal         Diaphragm:        Appears normal
Choroid Plexus:   Previously seen        Stomach:          Appears normal, left
sided
Cerebellum:       Appears normal         Abdomen:          Appears normal
Posterior Fossa:  Previously seen        Abdominal Wall:   Previously seen
Nuchal Fold:      Previously seen        Cord Vessels:     Previously seen
Face:             Orbits previously      Kidneys:          Appear normal
seen
Lips:             Appears normal         Bladder:          Appears normal
Heart:            Appears normal         Spine:            Previously seen
(4CH, axis, and
situs)
RVOT:             Appears normal         Lower             Previously seen
Extremities:
LVOT:             Appears normal         Upper             Previously seen
Extremities:

Other:  Male gender. Technically difficult due to fetal position.
Cervix Uterus Adnexa

Cervical Length:    2.9      cm

Cervix:       Normal appearance by transabdominal scan.

Adnexa:     No abnormality visualized.
Comments

Patient reported contractions, back pain and pressure.  Had a
normal cervical length on ultrasound today.  Patient was sent
to [facility] for further evaluation due to symptoms.
Impression

Single IUP at 23w 3d
Normal interval anatomy
A posterior placenta is noted - patient was noted to have a
persistent contraction, but the leading edge of the placenta is
> 2 cm from the internal os
(resolved low lying placenta)
Normal amniotic fluid volume

Cervical length 2.9 cm (transabdominal)
Recommendations

Follow-up ultrasounds as clinically indicated.

questions or concerns.

## 2015-09-17 IMAGING — US US FETAL BPP W/O NONSTRESS
1 series · 12 of 28 positions shown · non-contrast
Comparison: none

[Series 1: us ob follow up · 72 acquisitions, 12 frames shown]
[im 3/72]
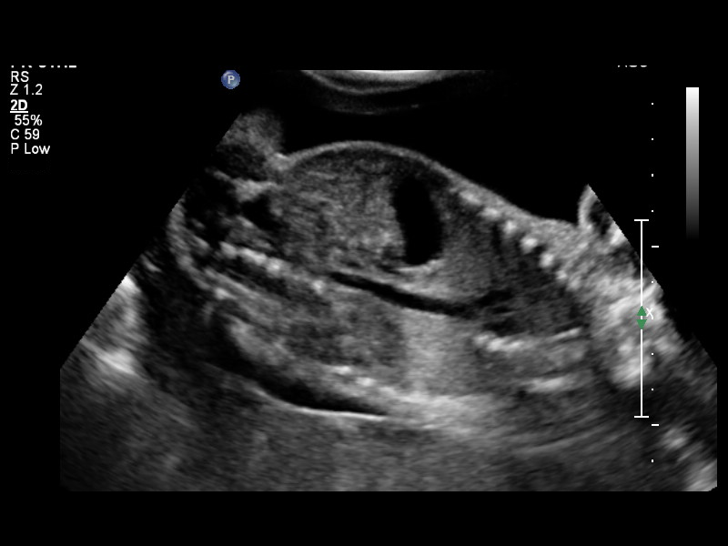
[im 8/72]
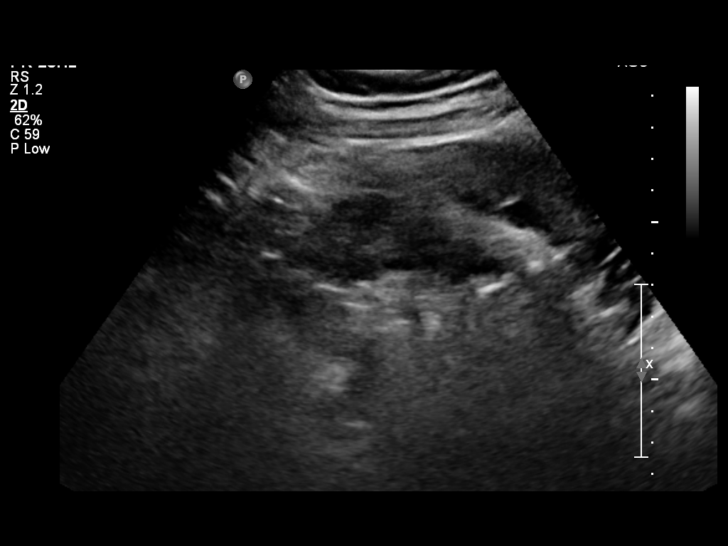
[im 14/72]
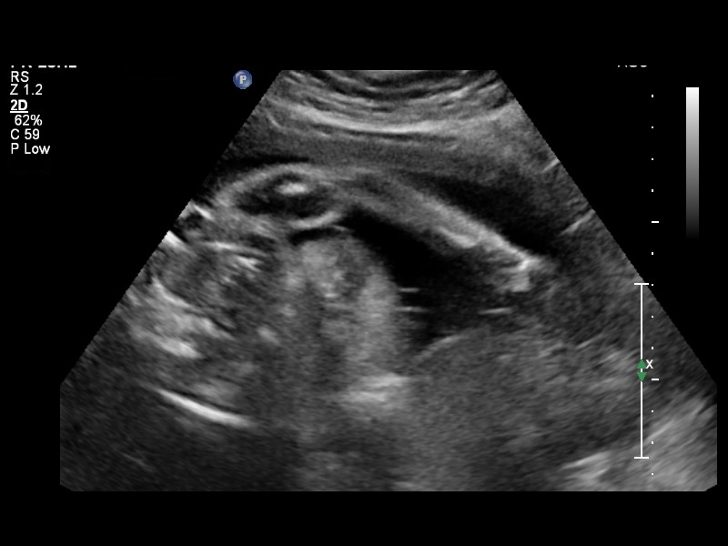
[im 22/72]
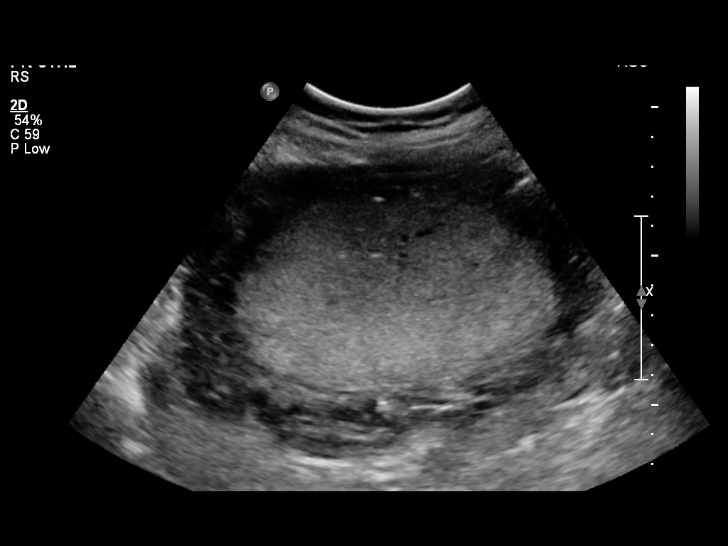
[im 27/72]
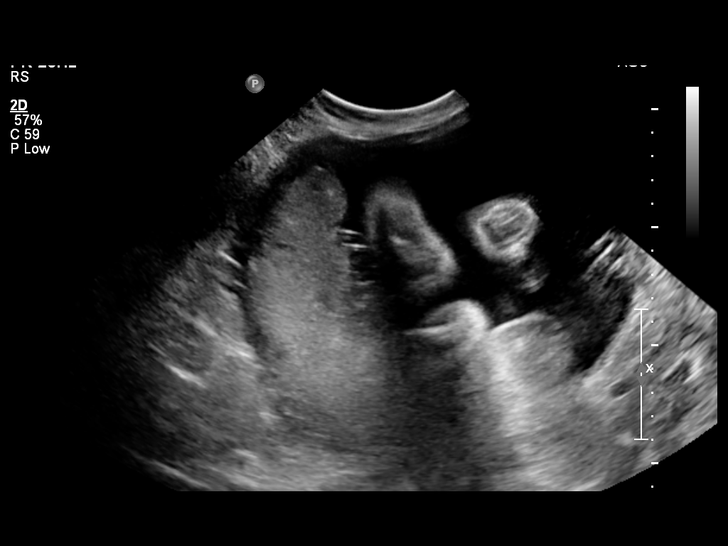
[im 32/72]
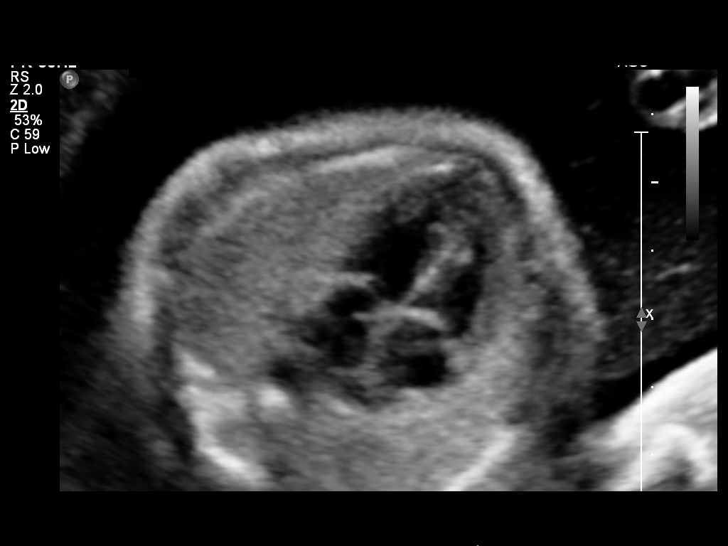
[im 40/72]
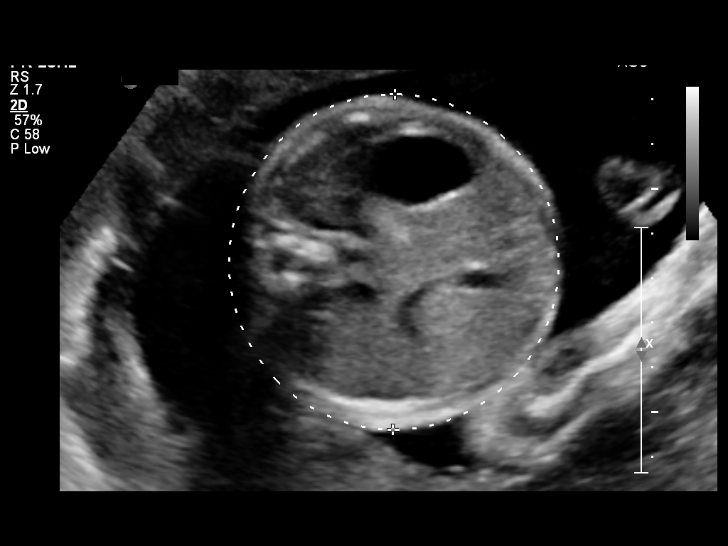
[im 45/72]
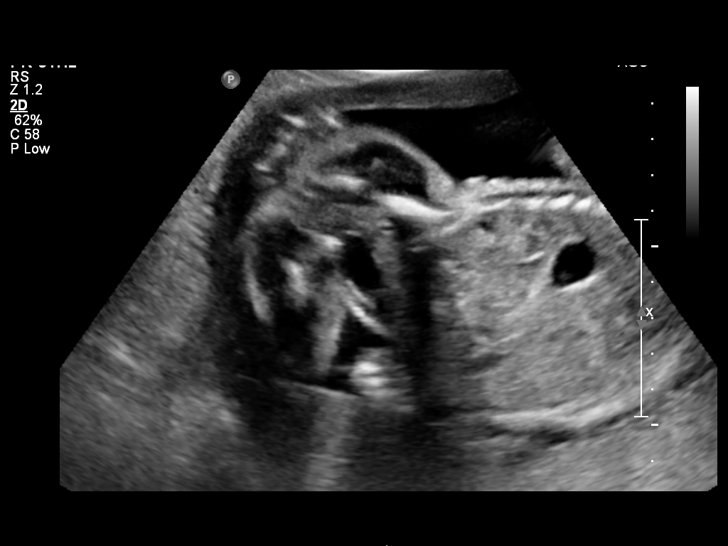
[im 50/72]
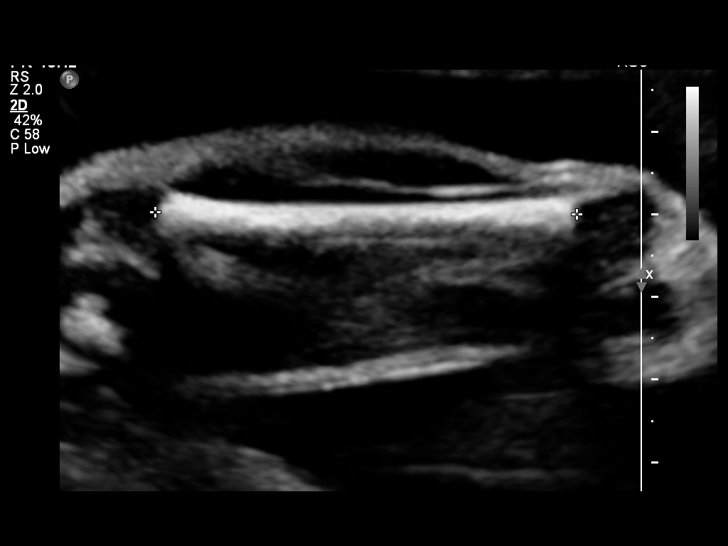
[im 58/72]
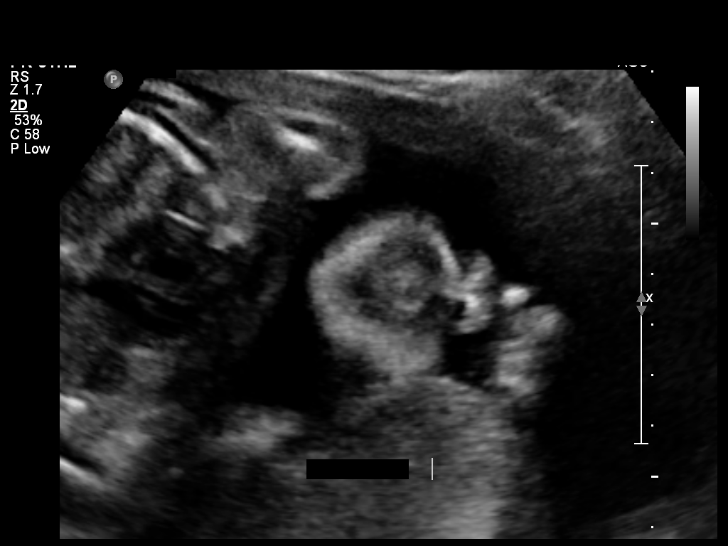
[im 64/72]
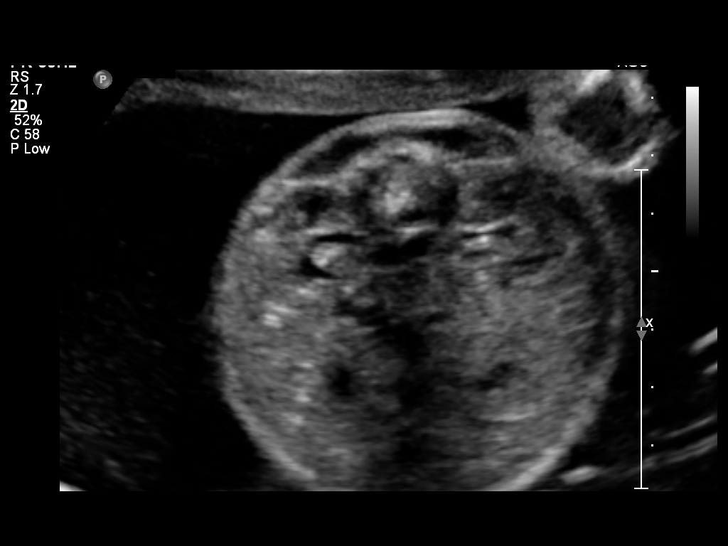
[im 69/72]
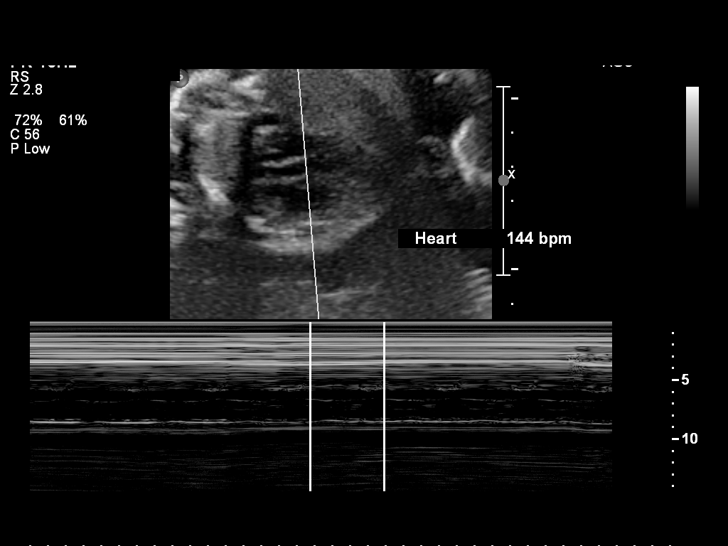

[12 of 28 positions shown; findings below may reference images not displayed]

OBSTETRICS REPORT
(Signed Final 07/01/2014 [DATE])

Service(s) Provided

US OB FOLLOW UP                                       76816.1
Indications

26 weeks gestation of pregnancy
Abnormal fetal heart rate/rhythm                      O76
Fetal Evaluation

Num Of Fetuses:    1
Fetal Heart Rate:  150                          bpm
Cardiac Activity:  Observed
Presentation:      Cephalic
Placenta:          Posterior, above cervical
os

Amniotic Fluid
AFI FV:      Subjectively within normal limits
Larg Pckt:    7.07  cm
Biophysical Evaluation

Amniotic F.V:   Pocket => 2 cm two         F. Tone:        Observed
planes
F. Movement:    Observed                   Score:          [DATE]
F. Breathing:   Observed
Biometry

BPD:     66.6  mm     G. Age:  26w 6d                CI:        67.44   70 - 86
FL/HC:      19.5   18.6 -
20.4
HC:     259.6  mm     G. Age:  28w 2d       81  %    HC/AC:      1.10   1.04 -
1.22
AC:     235.1  mm     G. Age:  27w 6d       81  %    FL/BPD:     76.0   71 - 87
FL:      50.6  mm     G. Age:  27w 1d       58  %    FL/AC:      21.5   20 - 24
HUM:     46.6  mm     G. Age:  27w 3d       70  %

Est. FW:    2329  gm      2 lb 7 oz     76  %
Gestational Age

LMP:           26w 3d        Date:  12/28/13                 EDD:   10/04/14
U/S Today:     27w 4d                                        EDD:   09/26/14
Best:          26w 3d     Det. By:  LMP  (12/28/13)          EDD:   10/04/14
Anatomy

Cranium:          Appears normal         Aortic Arch:      Appears normal
Fetal Cavum:      Previously seen        Ductal Arch:      Previously seen
Ventricles:       Appears normal         Diaphragm:        Previously seen
Choroid Plexus:   Previously seen        Stomach:          Appears normal, left
sided
Cerebellum:       Previously seen        Abdomen:          Previously seen
Posterior Fossa:  Previously seen        Abdominal Wall:   Previously seen
Nuchal Fold:      Previously seen        Cord Vessels:     Previously seen
Face:             Orbits previously      Kidneys:          Appear normal
seen
Lips:             Previously seen        Bladder:          Appears normal
Heart:            Appears normal         Spine:            Previously seen
(4CH, axis, and
situs)
RVOT:             Appears normal         Lower             Previously seen
Extremities:
LVOT:             Appears normal         Upper             Previously seen
Extremities:

Other:  Male gender. Technically difficult due to fetal position.
Targeted Anatomy

Fetal Central Nervous System
Lat. Ventricles:
Cervix Uterus Adnexa

Cervical Length:    4.59     cm

Cervix:       Normal appearance by transabdominal scan.
Uterus:       No abnormality visualized.
Left Ovary:    Not visualized.
Right Ovary:   Not visualized.

Adnexa:     No abnormality visualized. No adnexal mass visualized.
Comments

Ms. Aujla was referred [facility] due to
concern for a fetal arrhythmia on NST today.  The fetus
remained in normal sinus rhythm throughout this ultrasound
exam.
Impression

Single living intrauterine pregnancy at 26 weeks 3 days.
Appropriate interval fetal growth (76%).
Normal amniotic fluid volume.
Normal interval fetal anatomy.
Recommendations

Recommend follow-up ultrasound examination if arrhythmia
recurs.

questions or concerns.

## 2015-12-19 IMAGING — CT CT HEAD W/O CM
2 series · 15 of 30 positions shown, 19 images · non-contrast
Comparison: No priors.

CLINICAL DATA: 28-year-old female with severe right-sided headache
not responsive to narcotic medications or nonsteroidal
anti-inflammatory medications. Concern for possible PRES or venous
sinus thrombosis. Hypertension, preeclampsia and complicated
postpartum course.

EXAM:
CT HEAD WITHOUT CONTRAST
TECHNIQUE: Contiguous axial images were obtained from the base of the skull
through the vertex without intravenous contrast.

[Series 2: routine head w/o · axial · non-contrast · 0.41mm/px · z∈[-30,+114]mm · 13 of 36 slices shown, 17 images]
[im 3/36  brain]
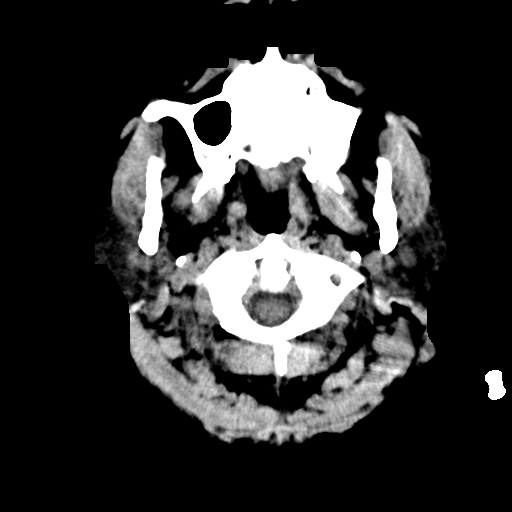
[im 3/36  bone]
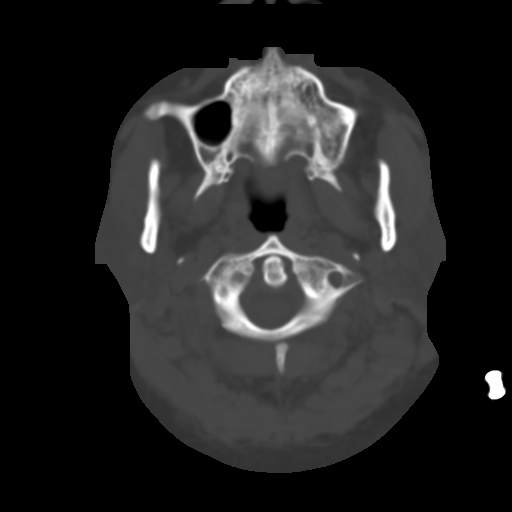
[im 6/36  brain]
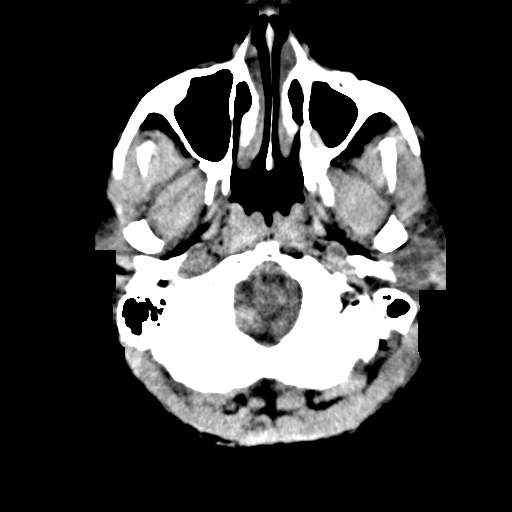
[im 8/36  brain]
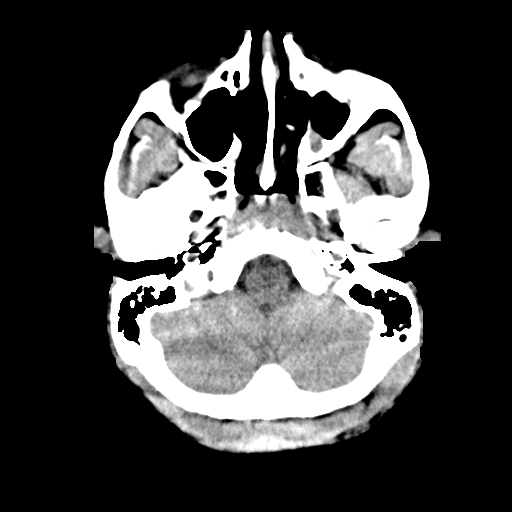
[im 11/36  brain]
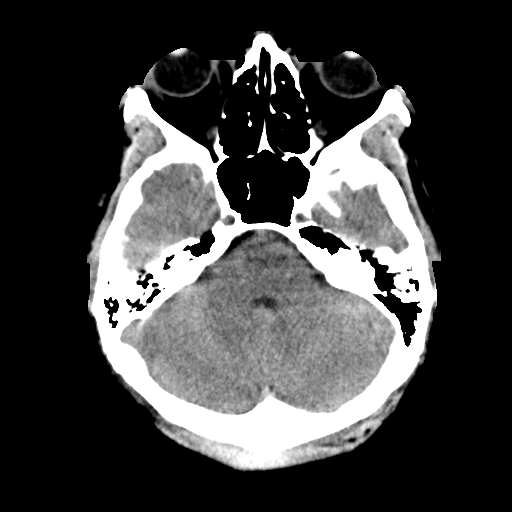
[im 13/36  brain]
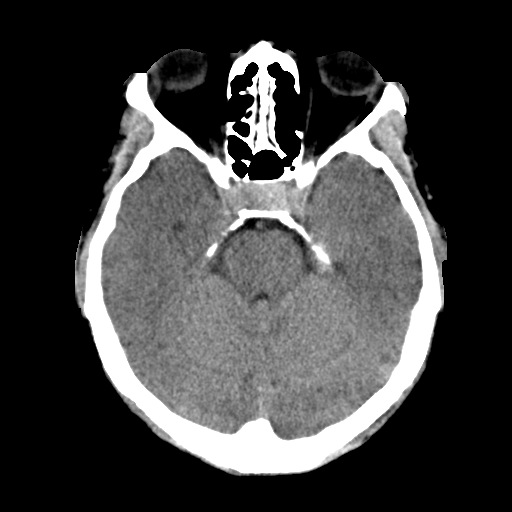
[im 13/36  bone]
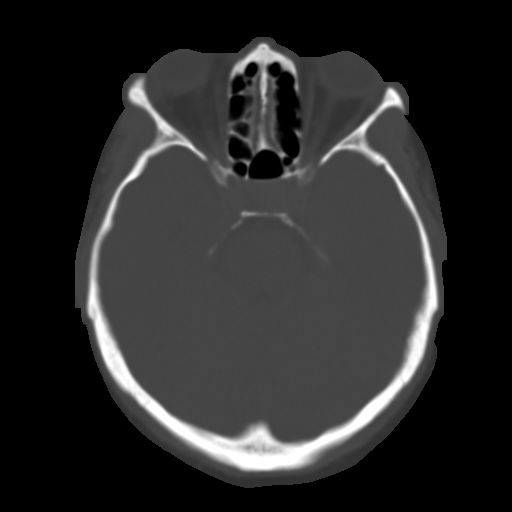
[im 16/36  brain]
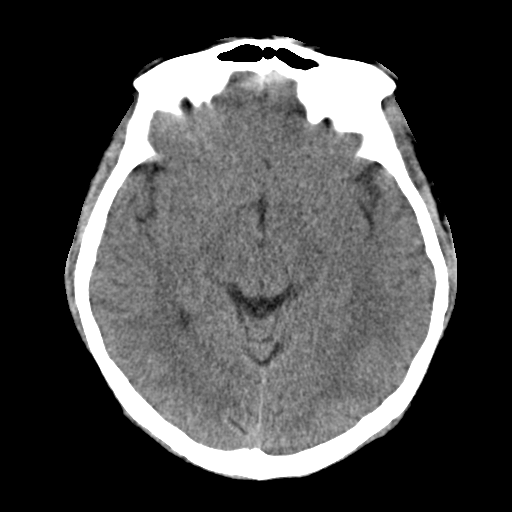
[im 18/36  brain]
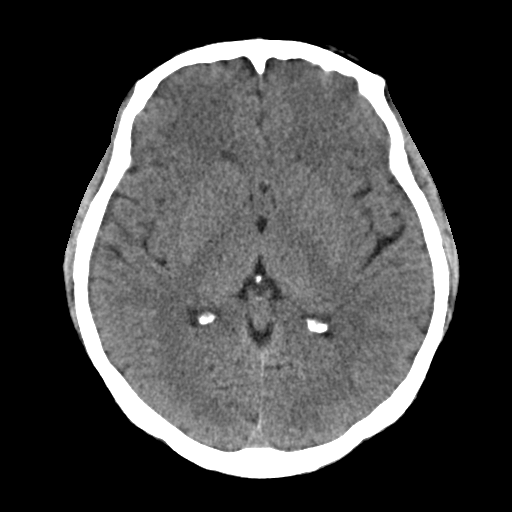
[im 21/36  brain]
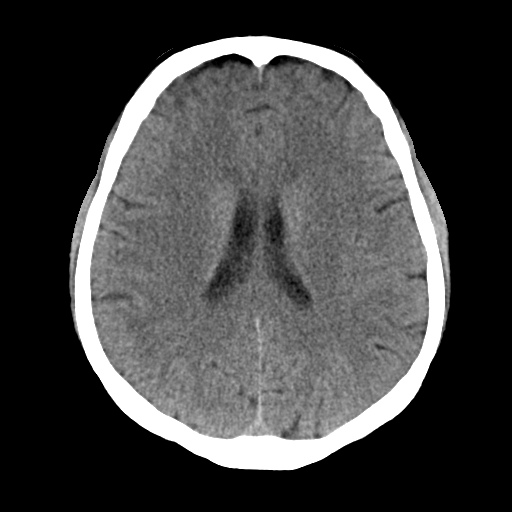
[im 23/36  brain]
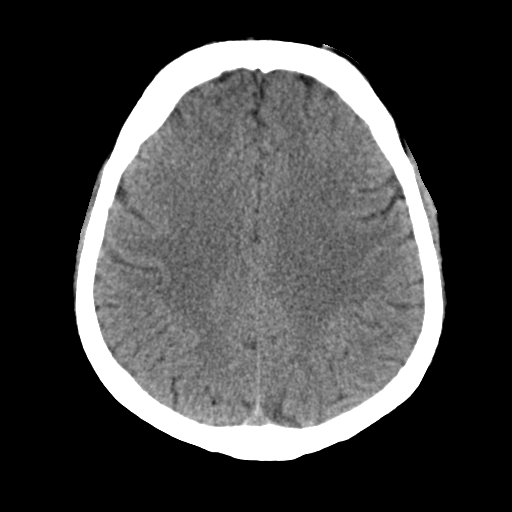
[im 23/36  bone]
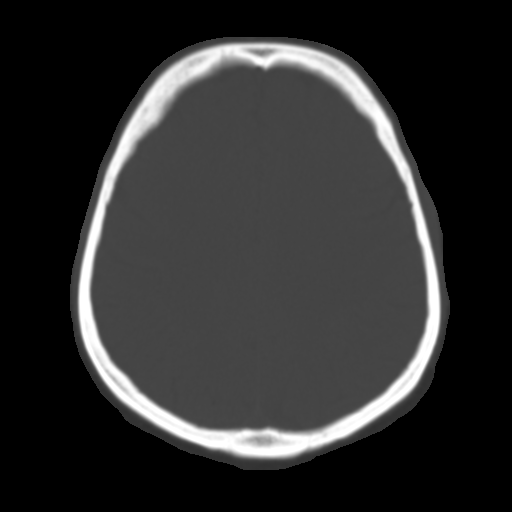
[im 26/36  brain]
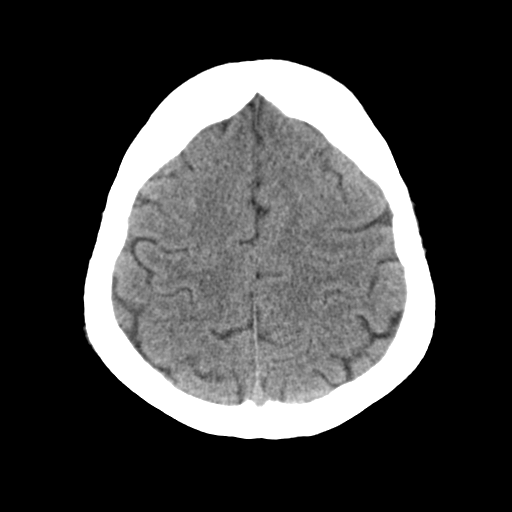
[im 28/36  brain]
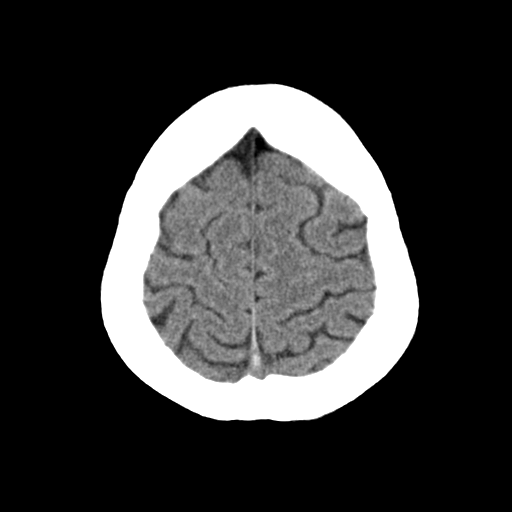
[im 31/36  brain]
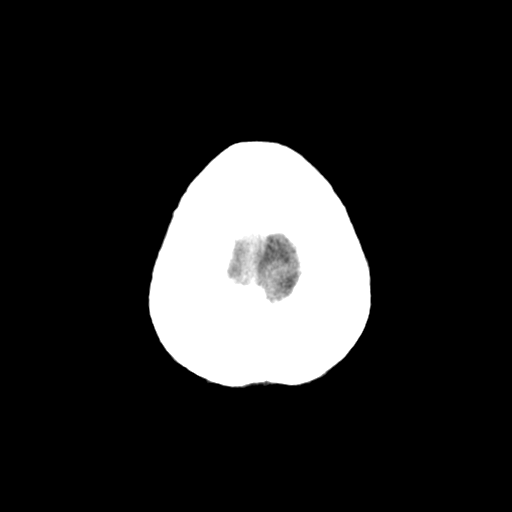
[im 33/36  brain]
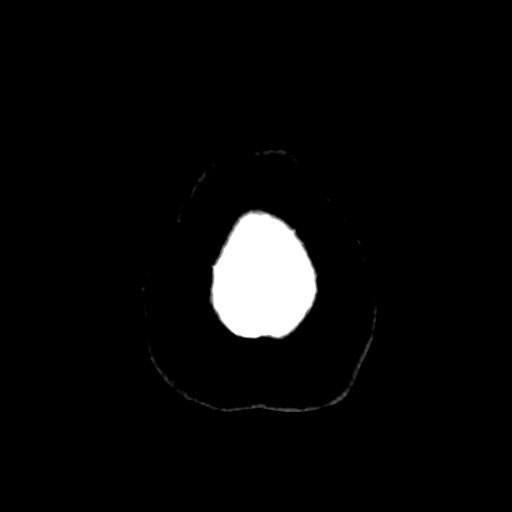
[im 33/36  bone]
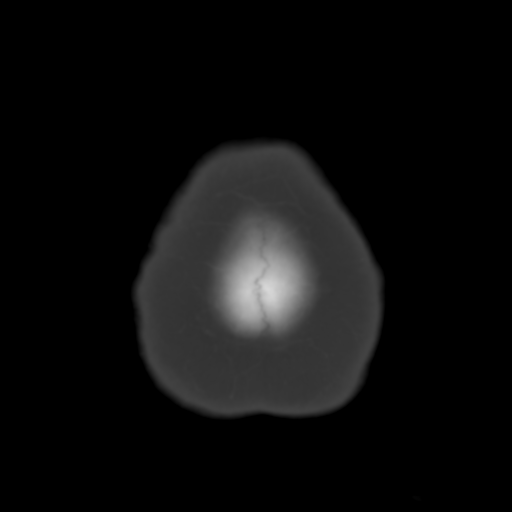

[Series 3: routine head bone · axial · 0.41mm/px · z∈[-30,-6]mm · 2 of 36 slices shown]
[im 3/36  bone]
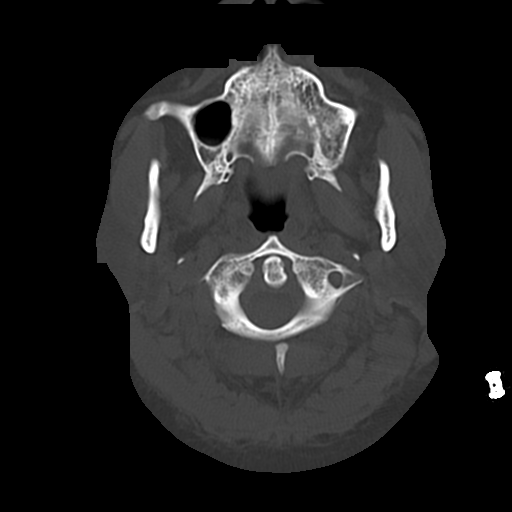
[im 8/36  bone]
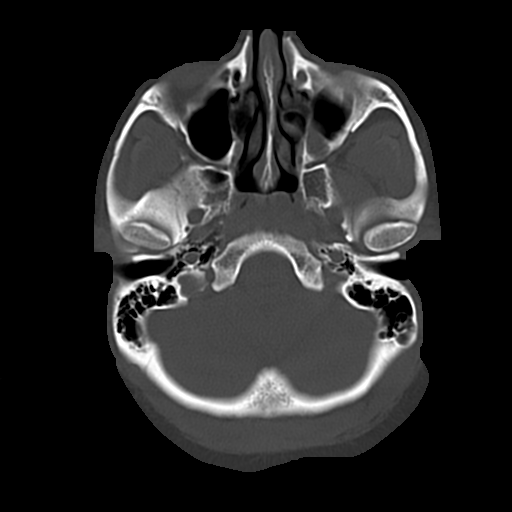

[15 of 30 positions shown; findings below may reference images not displayed]

FINDINGS: No acute intracranial abnormalities. Specifically, no evidence of
acute intracranial hemorrhage, no definite findings of
acute/subacute cerebral ischemia, no mass, mass effect,
hydrocephalus or abnormal intra or extra-axial fluid collections.
Visualized paranasal sinuses and mastoids are well pneumatized, with
exception of a small polypoid lesion in the posterior aspect of the
left maxillary sinus, which may represent a mucosal retention cyst
or polyp. No acute displaced skull fractures are identified.
IMPRESSION: *No acute intracranial abnormalities.
*The appearance of the brain is normal.

## 2021-02-28 ENCOUNTER — Ambulatory Visit: Payer: Self-pay

## 2021-02-28 ENCOUNTER — Ambulatory Visit: Payer: Commercial Managed Care - PPO | Attending: Internal Medicine

## 2021-02-28 DIAGNOSIS — Z23 Encounter for immunization: Secondary | ICD-10-CM

## 2021-02-28 NOTE — Progress Notes (Signed)
° °  Covid-19 Vaccination Clinic  Name:  Paige Bates    MRN: [id number] DOB: 1986/01/16  02/28/2021  Paige Bates was observed post Covid-19 immunization for 15 minutes without incident. She was provided with Vaccine Information Sheet and instruction to access the V-Safe system.   Paige Bates was instructed to call 911 with any severe reactions post vaccine: Difficulty breathing  Swelling of face and throat  A fast heartbeat  A bad rash all over body  Dizziness and weakness   Immunizations Administered     Name Date Dose VIS Date Route   JANSSEN COVID-19 VACCINE 02/28/2021  1:14 PM 0.5 mL 12/16/2019 Intramuscular   Manufacturer: [employer]   Lot: [id number]   NDC: 14782-956-21

## 2021-03-02 ENCOUNTER — Other Ambulatory Visit (HOSPITAL_BASED_OUTPATIENT_CLINIC_OR_DEPARTMENT_OTHER): Payer: Self-pay

## 2021-03-02 MED ORDER — COVID-19 AD26 VACCINE(JANSSEN) 0.5 ML IM SUSP
INTRAMUSCULAR | 0 refills | Status: AC
  Filled 2021-03-02: qty 0.5, 1d supply, fill #0
